# Patient Record
Sex: Male | Born: 1980 | Race: Black or African American | Hispanic: No | Marital: Single | Smoking: Never smoker
Health system: Southern US, Community
[De-identification: ages and names within clinical notes are randomized; demographics above are authoritative.]

## PROBLEM LIST (undated history)

## (undated) DIAGNOSIS — M549 Dorsalgia, unspecified: Secondary | ICD-10-CM

## (undated) DIAGNOSIS — R569 Unspecified convulsions: Secondary | ICD-10-CM

## (undated) DIAGNOSIS — J45909 Unspecified asthma, uncomplicated: Secondary | ICD-10-CM

## (undated) HISTORY — DX: Unspecified convulsions: R56.9

---

## 2002-03-01 ENCOUNTER — Emergency Department (HOSPITAL_COMMUNITY): Admission: EM | Admit: 2002-03-01 | Discharge: 2002-03-01 | Payer: Self-pay | Admitting: *Deleted

## 2002-03-01 ENCOUNTER — Encounter: Payer: Self-pay | Admitting: *Deleted

## 2008-04-12 ENCOUNTER — Ambulatory Visit: Payer: Self-pay | Admitting: Family Medicine

## 2008-04-12 DIAGNOSIS — J309 Allergic rhinitis, unspecified: Secondary | ICD-10-CM | POA: Insufficient documentation

## 2008-04-12 DIAGNOSIS — R569 Unspecified convulsions: Secondary | ICD-10-CM

## 2008-06-28 ENCOUNTER — Ambulatory Visit: Payer: Self-pay | Admitting: Family Medicine

## 2008-07-04 ENCOUNTER — Encounter (INDEPENDENT_AMBULATORY_CARE_PROVIDER_SITE_OTHER): Payer: Self-pay | Admitting: Family Medicine

## 2008-07-29 ENCOUNTER — Encounter (INDEPENDENT_AMBULATORY_CARE_PROVIDER_SITE_OTHER): Payer: Self-pay | Admitting: Family Medicine

## 2008-09-27 ENCOUNTER — Ambulatory Visit: Payer: Self-pay | Admitting: Family Medicine

## 2008-09-27 DIAGNOSIS — G809 Cerebral palsy, unspecified: Secondary | ICD-10-CM | POA: Insufficient documentation

## 2009-10-06 ENCOUNTER — Emergency Department (HOSPITAL_COMMUNITY): Admission: EM | Admit: 2009-10-06 | Discharge: 2009-10-06 | Payer: Self-pay | Admitting: Emergency Medicine

## 2009-10-13 ENCOUNTER — Ambulatory Visit (HOSPITAL_COMMUNITY): Admission: RE | Admit: 2009-10-13 | Discharge: 2009-10-13 | Payer: Self-pay | Admitting: Family Medicine

## 2009-11-12 ENCOUNTER — Ambulatory Visit (HOSPITAL_COMMUNITY): Admission: RE | Admit: 2009-11-12 | Discharge: 2009-11-12 | Payer: Self-pay | Admitting: Family Medicine

## 2010-03-30 ENCOUNTER — Emergency Department (HOSPITAL_COMMUNITY)
Admission: EM | Admit: 2010-03-30 | Discharge: 2010-03-30 | Payer: Self-pay | Source: Home / Self Care | Admitting: Emergency Medicine

## 2010-07-26 ENCOUNTER — Emergency Department (HOSPITAL_COMMUNITY): Payer: Medicare Other

## 2010-07-26 ENCOUNTER — Emergency Department (HOSPITAL_COMMUNITY)
Admission: EM | Admit: 2010-07-26 | Discharge: 2010-07-26 | Disposition: A | Discharge status: Home / Self Care | Payer: Medicare Other | Attending: Emergency Medicine | Admitting: Emergency Medicine

## 2010-07-26 DIAGNOSIS — G40802 Other epilepsy, not intractable, without status epilepticus: Secondary | ICD-10-CM | POA: Insufficient documentation

## 2010-07-26 DIAGNOSIS — Z79899 Other long term (current) drug therapy: Secondary | ICD-10-CM | POA: Insufficient documentation

## 2010-07-26 DIAGNOSIS — J40 Bronchitis, not specified as acute or chronic: Secondary | ICD-10-CM | POA: Insufficient documentation

## 2010-07-26 DIAGNOSIS — J479 Bronchiectasis, uncomplicated: Secondary | ICD-10-CM | POA: Insufficient documentation

## 2010-07-26 LAB — BASIC METABOLIC PANEL
BUN: 10 mg/dL (ref 6–23)
Chloride: 107 mEq/L (ref 96–112)
GFR calc non Af Amer: >60 mL/min (ref >60)
Potassium: 4.3 mEq/L (ref 3.5–5.1)
Sodium: 137 mEq/L (ref 135–145)

## 2010-07-26 MED ORDER — IOHEXOL 300 MG/ML  SOLN
80.0000 mL | Freq: Once | INTRAMUSCULAR | Status: AC | PRN
  Administered 2010-07-26: 80 mL via INTRAVENOUS

## 2010-08-25 ENCOUNTER — Encounter (INDEPENDENT_AMBULATORY_CARE_PROVIDER_SITE_OTHER): Payer: Medicare Other | Admitting: Thoracic Surgery

## 2010-08-25 DIAGNOSIS — J479 Bronchiectasis, uncomplicated: Secondary | ICD-10-CM

## 2010-08-26 NOTE — Letter (Signed)
Aug 25, 2010  Edward L. Juanetta Gosling, MD [address] [place], [place] [address]  Re:  Jeremy [name], Jeremy [name]                DOB:  04-Dec-1980  Dear Ramon Dredge:  I appreciate the opportunity of seeing the patient.  This 30 year old patient had a [place]ke[place] history of seizure disorder and apparently was found to have a left lower lobe chronic infection.  He gets infection every year according to his family.  He had a bronchoscopy in 2000, showed mucus plugging in left lower lobe.  Recent CT scan showed a completely destroyed left lower lobe and this with secondary to bronchiectasis.  He continues no evidence of infection at other places.  He also has a seizure disorder and he is a nonsmoker and lives with his family.  He has had no hemoptysis.  PAST MEDICAL HISTORY:  Unremarkable except for the seizure disorders and probably decreased mental ability.  FAMILY HISTORY:  Noncontributory.  SOCIAL HISTORY:  He is single.  Does not smoke or drink.  REVIEW OF SYSTEMS:  His weight is stable. CARDIAC:  No angina or atrial fibrillation. PULMONARY:  See history of present illness.  No hemoptysis. GI:  No nausea, vomiting, constipation, or diarrhea. GU:  No kidney disease, dysuria, or frequent urination. VASCULAR:  No claudication, DVT, or TIAs. NEUROLOGIC:  See history of present illness. MUSCULOSKELETAL:  Rash. PSYCHIATRIC:  No depression or nervous. EYES AND ENT:  No changes in his eyesight or hearing. HEMATOLOGIC:  No problems with bleeding, clotting disorders.  On evaluation, it looks like his left lower lobe is completely destroyed.  I think he needs to have repeat bronchoscopy and then probably we will proceed with a left lower lobectomy.  I have tentatively scheduled this for May 31 at [facility].  I appreciate the opportunity of seeing the patient.  Ines Bloomer, M.D. Electronically Signed  DPB/MEDQ  D:  08/25/2010  T:  08/26/2010  Job:  [id number]  cc:   Mila Homer. Sudie Bailey, M.D.

## 2010-09-03 ENCOUNTER — Encounter (HOSPITAL_COMMUNITY)
Admission: RE | Admit: 2010-09-03 | Discharge: 2010-09-03 | Disposition: A | Discharge status: Home / Self Care | Payer: Medicare Other | Source: Ambulatory Visit | Attending: Thoracic Surgery | Admitting: Thoracic Surgery

## 2010-09-03 LAB — CBC
HCT: 46 % (ref 39.0–52.0)
Hemoglobin: 16.4 g/dL (ref 13.0–17.0)
MCV: 82 fL (ref 78.0–100.0)
RBC: 5.61 MIL/uL (ref 4.22–5.81)
RDW: 14.3 % (ref 11.5–15.5)
WBC: 3.2 10*3/uL — ABNORMAL LOW (ref 4.0–10.5)

## 2010-09-03 LAB — SURGICAL PCR SCREEN: Staphylococcus aureus: NEGATIVE

## 2010-09-03 LAB — BLOOD GAS, ARTERIAL
Acid-base deficit: 3.4 mmol/L — ABNORMAL HIGH (ref 0.0–2.0)
Bicarbonate: 20.6 mEq/L (ref 20.0–24.0)
TCO2: 21.6 mmol/L (ref 0–100)
pCO2 arterial: 33.9 mmHg — ABNORMAL LOW (ref 35.0–45.0)
pH, Arterial: 7.399 (ref 7.350–7.450)
pO2, Arterial: 75.2 mmHg — ABNORMAL LOW (ref 80.0–100.0)

## 2010-09-03 LAB — URINALYSIS, ROUTINE W REFLEX MICROSCOPIC
Nitrite: NEGATIVE
Specific Gravity, Urine: 1.015 (ref 1.005–1.030)
Urobilinogen, UA: 0.2 mg/dL (ref 0.0–1.0)
pH: 7.5 (ref 5.0–8.0)

## 2010-09-03 LAB — URINE MICROSCOPIC-ADD ON

## 2010-09-03 LAB — COMPREHENSIVE METABOLIC PANEL
AST: 15 U/L (ref 0–37)
BUN: 8 mg/dL (ref 6–23)
CO2: 21 mEq/L (ref 19–32)
Chloride: 107 mEq/L (ref 96–112)
Creatinine, Ser: 1.03 mg/dL (ref 0.4–1.5)
GFR calc non Af Amer: >60 mL/min (ref >60)
Glucose, Bld: 104 mg/dL — ABNORMAL HIGH (ref 70–99)
Total Bilirubin: 0.4 mg/dL (ref 0.3–1.2)

## 2010-09-03 LAB — ABO/RH: ABO/RH(D): A POS

## 2010-09-03 LAB — PROTIME-INR
INR: 1.05 (ref 0.00–1.49)
Prothrombin Time: 13.9 seconds (ref 11.6–15.2)

## 2010-09-07 ENCOUNTER — Other Ambulatory Visit: Payer: Self-pay | Admitting: Thoracic Surgery

## 2010-09-07 ENCOUNTER — Inpatient Hospital Stay (HOSPITAL_COMMUNITY): Payer: Medicare Other

## 2010-09-07 ENCOUNTER — Inpatient Hospital Stay (HOSPITAL_COMMUNITY)
Admission: RE | Admit: 2010-09-07 | Discharge: 2010-09-12 | DRG: 164 | Disposition: A | Discharge status: Home / Self Care | Payer: Medicare Other | Source: Ambulatory Visit | Attending: Thoracic Surgery | Admitting: Thoracic Surgery

## 2010-09-07 ENCOUNTER — Ambulatory Visit (HOSPITAL_COMMUNITY)
Admission: RE | Admit: 2010-09-07 | Discharge: 2010-09-07 | Disposition: A | Discharge status: Home / Self Care | Payer: Medicare Other | Source: Ambulatory Visit | Attending: Thoracic Surgery | Admitting: Thoracic Surgery

## 2010-09-07 DIAGNOSIS — G40909 Epilepsy, unspecified, not intractable, without status epilepticus: Secondary | ICD-10-CM | POA: Diagnosis present

## 2010-09-07 DIAGNOSIS — J479 Bronchiectasis, uncomplicated: Secondary | ICD-10-CM

## 2010-09-07 DIAGNOSIS — Z880 Allergy status to penicillin: Secondary | ICD-10-CM

## 2010-09-07 DIAGNOSIS — Y9210 Unspecified residential institution as the place of occurrence of the external cause: Secondary | ICD-10-CM | POA: Diagnosis not present

## 2010-09-07 DIAGNOSIS — Z01810 Encounter for preprocedural cardiovascular examination: Secondary | ICD-10-CM

## 2010-09-07 DIAGNOSIS — IMO0002 Reserved for concepts with insufficient information to code with codable children: Secondary | ICD-10-CM | POA: Diagnosis not present

## 2010-09-07 DIAGNOSIS — Z01812 Encounter for preprocedural laboratory examination: Secondary | ICD-10-CM

## 2010-09-07 DIAGNOSIS — Y836 Removal of other organ (partial) (total) as the cause of abnormal reaction of the patient, or of later complication, without mention of misadventure at the time of the procedure: Secondary | ICD-10-CM | POA: Diagnosis not present

## 2010-09-07 HISTORY — PX: OTHER SURGICAL HISTORY: SHX169

## 2010-09-07 LAB — CBC
HCT: 36.6 % — ABNORMAL LOW (ref 39.0–52.0)
MCH: 28.2 pg (ref 26.0–34.0)
MCHC: 34.4 g/dL (ref 30.0–36.0)
MCHC: 34 g/dL (ref 30.0–36.0)
Platelets: 168 10*3/uL (ref 150–400)
RBC: 3.4 MIL/uL — ABNORMAL LOW (ref 4.22–5.81)
RDW: 14.3 % (ref 11.5–15.5)

## 2010-09-07 NOTE — H&P (Signed)
  NAME:  Jeremy [name], Jeremy [name] NO.:  [id number]  MEDICAL RECORD NO.:  [id number]           PATIENT TYPE:  I  LOCATION:  [place]                         FACILITY:  [facility]  PHYSICIAN:  Ines Bloomer, M.D. DATE OF BIRTH:  1980-08-06  DATE OF ADMISSION:  08/26/2010 DATE OF DISCHARGE:                             HISTORY & PHYSICAL   CHIEF COMPLAINT:  Chronic infection.  HISTORY OF PRESENT ILLNESS:  This is a 30 year old African American male who was found to have [place] history of seizure disorders and left lower lobe infection.  He has infection at least once or twice a year.  He had a bronchoscopy in 2000, showed mucous plug in the left lower lobe.  A recent CT scan showed a destroyed left lower lobe secondary to bronchiectasis.  No other areas of infection.  He is a nonsmoker.  Has had no hemoptysis.  Has a Ladouceur history of seizures.  PAST MEDICAL HISTORY:  Significant for seizures.  MEDICATIONS:   75 mg a day, Topamax 200 mg a day, and ProAir.  He has a rash.  FAMILY HISTORY:  Noncontributory.  SOCIAL HISTORY:  Single.  Does not drink or smoke.  REVIEW OF SYSTEMS:  Weight stable.  CARDIAC:  No angina or atrial fibrillation.  PULMONARY:  See history of present illness.  GI:  No nausea, vomiting, constipation, or diarrhea.  GU:  No kidney disease, dysuria, or frequent urination.  VASCULAR:  No claudication, DVT, or TIAs.  NEUROLOGIC:  No dizziness, headaches, blackouts, or seizures. MUSCULOSKELETAL:  Rash.  PSYCHIATRIC:  No depression or nervousness. ENT:  No changes in eyesight or hearing.  HEMATOLOGIC:  No problems with bleeding, clotting disorders, or anemia.  PHYSICAL EXAM:  VITAL SIGNS:  His blood pressure is 116/70, pulse 72, respirations 18, sats were 95%. HEAD, EYES, EARS, NOSE, AND THROAT:  Unremarkable. NECK:  Supple without thyromegaly.  There is no supraclavicular or axillary adenopathy. CHEST:  Clear to auscultation and percussion. HEART:  Regular  sinus rhythm.  No murmurs. ABDOMEN:  Soft.  No hepatosplenomegaly. EXTREMITIES:  Pulses are 2+.  There is no clubbing or edema. NEUROLOGIC:  He is oriented x3.  Sensory and motor intact.  Cranial nerves intact.  IMPRESSION: 1. Chronic bronchiectasis, left lower lobe. 2. Seizure disorder.  PLAN:  Left lower lobectomy.     Ines Bloomer, M.D.     DPB/MEDQ  D:  09/04/2010  T:  09/05/2010  Job:  [id number]  Electronically Signed by Jovita Gamma M.D. on 09/07/2010 12:51:26 PM

## 2010-09-08 ENCOUNTER — Inpatient Hospital Stay (HOSPITAL_COMMUNITY): Payer: Medicare Other

## 2010-09-08 DIAGNOSIS — IMO0002 Reserved for concepts with insufficient information to code with codable children: Secondary | ICD-10-CM

## 2010-09-08 HISTORY — PX: OTHER SURGICAL HISTORY: SHX169

## 2010-09-08 LAB — BASIC METABOLIC PANEL
Calcium: 7.6 mg/dL — ABNORMAL LOW (ref 8.4–10.5)
GFR calc Af Amer: >60 mL/min (ref >60)
GFR calc non Af Amer: >60 mL/min (ref >60)
Sodium: 137 mEq/L (ref 135–145)

## 2010-09-08 LAB — CBC
HCT: 31.5 % — ABNORMAL LOW (ref 39.0–52.0)
Platelets: 117 10*3/uL — ABNORMAL LOW (ref 150–400)
RDW: 14.3 % (ref 11.5–15.5)
WBC: 10.1 10*3/uL (ref 4.0–10.5)

## 2010-09-09 ENCOUNTER — Inpatient Hospital Stay (HOSPITAL_COMMUNITY): Payer: Medicare Other

## 2010-09-09 LAB — CBC
HCT: 32 % — ABNORMAL LOW (ref 39.0–52.0)
Hemoglobin: 11.1 g/dL — ABNORMAL LOW (ref 13.0–17.0)
RBC: 3.87 MIL/uL — ABNORMAL LOW (ref 4.22–5.81)
WBC: 12.1 10*3/uL — ABNORMAL HIGH (ref 4.0–10.5)

## 2010-09-09 LAB — COMPREHENSIVE METABOLIC PANEL
ALT: 10 U/L (ref 0–53)
Alkaline Phosphatase: 35 U/L — ABNORMAL LOW (ref 39–117)
CO2: 23 mEq/L (ref 19–32)
GFR calc non Af Amer: >60 mL/min (ref >60)
Glucose, Bld: 117 mg/dL — ABNORMAL HIGH (ref 70–99)
Potassium: 4.1 mEq/L (ref 3.5–5.1)
Sodium: 136 mEq/L (ref 135–145)
Total Protein: 5.2 g/dL — ABNORMAL LOW (ref 6.0–8.3)

## 2010-09-09 LAB — TYPE AND SCREEN
ABO/RH(D): A POS
Antibody Screen: NEGATIVE
Unit division: 0
Unit division: 0

## 2010-09-09 LAB — CULTURE, RESPIRATORY W GRAM STAIN

## 2010-09-09 LAB — POCT I-STAT 3, ART BLOOD GAS (G3+)
Acid-base deficit: 6 mmol/L — ABNORMAL HIGH (ref 0.0–2.0)
Bicarbonate: 20.9 mEq/L (ref 20.0–24.0)
O2 Saturation: 98 %
TCO2: 22 mmol/L (ref 0–100)
pO2, Arterial: 118 mmHg — ABNORMAL HIGH (ref 80.0–100.0)

## 2010-09-09 LAB — POCT I-STAT 7, (LYTES, BLD GAS, ICA,H+H)
Hemoglobin: 10.5 g/dL — ABNORMAL LOW (ref 13.0–17.0)
Potassium: 4.4 mEq/L (ref 3.5–5.1)
Sodium: 137 mEq/L (ref 135–145)
TCO2: 20 mmol/L (ref 0–100)
pH, Arterial: 7.242 — ABNORMAL LOW (ref 7.350–7.450)

## 2010-09-10 ENCOUNTER — Inpatient Hospital Stay (HOSPITAL_COMMUNITY): Payer: Medicare Other

## 2010-09-10 LAB — CBC
HCT: 30 % — ABNORMAL LOW (ref 39.0–52.0)
Hemoglobin: 10.4 g/dL — ABNORMAL LOW (ref 13.0–17.0)
MCH: 28.8 pg (ref 26.0–34.0)
MCHC: 34.7 g/dL (ref 30.0–36.0)
MCV: 83.1 fL (ref 78.0–100.0)

## 2010-09-10 LAB — BASIC METABOLIC PANEL
BUN: 8 mg/dL (ref 6–23)
CO2: 25 mEq/L (ref 19–32)
Calcium: 8.2 mg/dL — ABNORMAL LOW (ref 8.4–10.5)
Creatinine, Ser: 0.68 mg/dL (ref 0.4–1.5)
Glucose, Bld: 114 mg/dL — ABNORMAL HIGH (ref 70–99)

## 2010-09-10 LAB — TISSUE CULTURE

## 2010-09-11 ENCOUNTER — Inpatient Hospital Stay (HOSPITAL_COMMUNITY): Payer: Medicare Other

## 2010-09-11 NOTE — Op Note (Signed)
  NAMEKALEEL, SCHMIEDER NO.:  [id number]  MEDICAL RECORD NO.:  [id number]  LOCATION:                                 FACILITY:  PHYSICIAN:  Ines Bloomer, M.D. DATE OF BIRTH:  Aug 25, 1980  DATE OF PROCEDURE: DATE OF DISCHARGE:                              OPERATIVE REPORT   PREOPERATIVE DIAGNOSIS:  Status post left lower lobectomy with postoperative hemorrhage.  POSTOPERATIVE DIAGNOSIS:  Status post left lower lobectomy with postoperative hemorrhage.  OPERATION PERFORMED:  Left thoracotomy, exploration for hemorrhage.  SURGEON:  Ines Bloomer, MD  General anesthesia.  This patient at approximately 12 hours earlier had a left lower lobectomy and did fine for the first 6-8 hours and then started having bleeding and his hematocrit went from 46 to 36 and then down to 28 with 2 episodes of hypotension.  Because of continued bleeding, he was brought back to the operating room for exploration.  After general anesthesia, he was turned to the left lateral thoracotomy position.  A single-lumen tube had been inserted.  He was prepped and draped in the usual sterile manner.  The previous thoracotomy incision was opened and the sutures were removed and the pericostals were removed and there was a moderate amount of blood clot in the left chest.  This was evacuated mechanically as well as with suction until it was completed.  We then looked for areas of bleeding.  The inferior pulmonary ligament had no areas of bleeding and there was no bleeding from the chest wall, but along the staple line of the left lower lobe there were few areas of bleeding from the staple line.  This was oversewn with 3-0 Vicryl.  The patient then had an intercostal muscle flap and there was no bleeding from that and no bleeding from any bronchial arteries.  After we secured the bleeding, we then applied CoSeal to the staple line, just it was an extra adequate coverage for any possible  bleeding.  Two 36 chest tubes were brought in through the old trocar sites and sutured in place with 0 silk.  The chest was closed with 3 pericostals, #1 Vicryl in the muscle layer, 2-0 Vicryl in the subcutaneous tissue and Ethicon skin clips. The patient was returned to recovery room in serious condition.     Ines Bloomer, M.D.     DPB/MEDQ  D:  09/08/2010  T:  09/08/2010  Job:  [id number]  Electronically Signed by Jovita Gamma M.D. on 09/11/2010 02:23:38 PM

## 2010-09-11 NOTE — Op Note (Signed)
NAMENOEH, SPARACINO NO.:  [id number]  MEDICAL RECORD NO.:  [id number]  LOCATION:  XRAY                         FACILITY:  [facility]  PHYSICIAN:  Ines Bloomer, M.D. DATE OF BIRTH:  21-Sep-1980  DATE OF PROCEDURE:  09/07/2010 DATE OF DISCHARGE:                              OPERATIVE REPORT   PREOPERATIVE DIAGNOSIS:  Left lower lobe bronchiectasis.  POSTOPERATIVE DIAGNOSIS:  Left lower lobe bronchiectasis.  OPERATION PERFORMED:  Left VATS minithoracotomy, left lower lobectomy.  SURGEON:  Ines Bloomer, MD.  ANESTHESIA:  General.  After percutaneous insertion of all monitoring lines, the patient underwent general anesthesia, was prepped and draped in the usual sterile manner.  A video bronchoscope was passed through the regular endotracheal tube.  The carina was in the midline.  The left mainstem was normal.  The right mainstem was normal.  The right upper lobe, right middle lobe, and right lower lobe orifices were normal.  The left upper lobe orifices were normal, but there was frank pus in the left lower lobe.  This 30 year old patient had a Fonte history of left lower lobe chronic infections, and the CT scan had showed that the left lower lobe was markedly contracted secondary to chronic infections.  Cultures were taken from the left lower lobe, and the video bronchoscope was removed. We then switched to a dual-lumen tube, and I turned the patient to the left lateral thoracotomy position and collapsed left lung.  The patient was then prepped and draped.  We did two trocar sites.  One in the anterior and posterior axons seventh intercostal space and inserted two trocars, and we could see the scarred left lower lobe with the scope. Because of this, we proceeded with an incision of the sixth intercostal space partially dividing latissimus and reflecting the serratus anteriorly and entering the space.  We decided to take down the intercostal muscle.  We  did that with electrocautery taking it out and putting it and then suturing it.  We did not divide it yet, and sutured it anteriorly.  TPA was placed in the space.  The inferior pulmonary ligament was taken down with electrocautery.  There were a lot of large blood vessels in the inferior pulmonary ligament, and these had to be clipped and then divided.  After the inferior pulmonary ligament was taken down, we then dissected out the inferior pulmonary vein, which was really stuck to the bronchus, but we were able to get around it and looped with a vascular tape.  We turned our attention to the fissure and we identified the pulmonary artery and we were able to dissect along the inferior portion of the fissure and divided it with a Covidien purple stapler.  We then dissected the superior portion of the fissure and divided it with Covidien purple stapler.  The line was a superior segmental branch exposed, and we divided it a Covidien gray 30 stapler. We dissected out the basilar branches of the pulmonary artery, which were very stuck to the bronchus, but we were able to get them off and stapled and divided with a Covidien hook stapler and finally, we dissected up the bronchus and stapled it with  a Covidien purple 45 stapler.  The left lower lobe was removed.  We checked for air leak and none was found.  We then divided the intercostal muscle medially and sutured the intercostal vessel through the bronchial stump with 3-0 silk stitches.  ProGEL was applied to the staple line.  A 24 chest tube was brought in anteriorly and a 28 chest tube brought in posteriorly, and sutured in place with 0 silk.  A Marcaine block was done in the usual fashion.  Chest was closed with three pericostal drilling through the seventh rib and passed around the sixth rib, and #1 Vicryl in the muscle layer, 2-0 Vicryl in the subcutaneous tissue and Dermabond for the skin. The patient was turned to the recovery room in  stable condition.     Ines Bloomer, M.D.     DPB/MEDQ  D:  09/07/2010  T:  09/07/2010  Job:  [id number]  Electronically Signed by Jovita Gamma M.D. on 09/11/2010 02:23:34 PM

## 2010-09-12 ENCOUNTER — Inpatient Hospital Stay (HOSPITAL_COMMUNITY): Payer: Medicare Other

## 2010-09-12 LAB — CBC
MCH: 28.4 pg (ref 26.0–34.0)
MCHC: 34 g/dL (ref 30.0–36.0)
MCV: 83.6 fL (ref 78.0–100.0)
Platelets: 252 10*3/uL (ref 150–400)

## 2010-09-12 LAB — BASIC METABOLIC PANEL
Calcium: 8.3 mg/dL — ABNORMAL LOW (ref 8.4–10.5)
Creatinine, Ser: 0.73 mg/dL (ref 0.4–1.5)
GFR calc non Af Amer: >60 mL/min (ref >60)
Sodium: 137 mEq/L (ref 135–145)

## 2010-09-19 NOTE — Discharge Summary (Signed)
[name], [name] NO.:  [id number]  MEDICAL RECORD NO.:  [id number]  LOCATION:                                 FACILITY:  PHYSICIAN:  Ines Bloomer, M.D. DATE OF BIRTH:  11/23/1980  DATE OF ADMISSION: DATE OF DISCHARGE:                              DISCHARGE SUMMARY   FINAL DIAGNOSIS:  Left lower lobe bronchiectasis.  IN-HOSPITAL DIAGNOSES:  Status post left lower lobectomy with postoperative hemorrhage.  SECONDARY DIAGNOSIS:  History of seizures.  IN-HOSPITAL OPERATIONS AND PROCEDURES: 1. Left video-assisted thoracoscopic surgery with minithoracotomy and     left lower lobectomy done by Dr. Edwyna Shell September 07, 2010. 2. Left thoracotomy for exploration for hemorrhage done by Dr. Edwyna Shell     September 08, 2010.  HISTORY AND PHYSICAL AND HOSPITAL COURSE:  The patient is a 30 year old African American male who was found to have a Jeremy [name] history of seizure disorders and left lower lobe infections.  He has left lower lobe infection at least once or twice year.  He had a bronchoscopy in 2000 which showed mucous plus in the left lower lobe.  A recent CT scan showed a scarred left lower lobe secondary to bronchiectasis.  There is no other areas of infection.  The patient is a nonsmoker.  He has had no hemoptysis.  The patient was seen and evaluated by Dr. Edwyna Shell.  Dr. Edwyna Shell discussed with the patient undergoing left lower lobectomy.  He discussed risks and benefits with the patient.  The patient acknowledged understanding and agreed to proceed.  Surgery was scheduled for September 07, 2010.  For further details of the patient's past medical history and physical exam please see dictated H and P.  The patient was taken to the operating room on September 07, 2010, where he underwent left video-assisted thoracoscopic surgery with left mini thoracotomy, left lower lobectomy done by Dr. Edwyna Shell.  The patient tolerated this procedure well and was transferred to the intensive care unit  in stable condition.  The patient was able to be extubated following surgery.  Postextubation, the patient was alert and oriented x4.  Neuro intact.  Evening of surgery, the patient had increased bleeding from chest tubes with a drop in his hematocrit.  He was taken back to the operating room that evening and underwent left thoracotomy for exploration for hemorrhage by Dr. Edwyna Shell.  The patient tolerated this procedure well and was transfer back to the intensive care unit in stable condition.  Following surgery following day the patient had no further bleeding.  His hematocrit was 32%.  Chest x-ray obtained was noted to be stable.  No air leak noted from chest tube.  Daily chest x- rays were followed and noted to remain stable.  Anterior chest tube was discontinued postop day #4 with remaining chest tube discontinued C postop day #5.  We will plan to obtain PA and lateral chest x-ray in the a.m.  His most recent x-ray showed tiny residual left apex pneumothorax with minimal atelectasis.  During this time, the patient was encouraged to use his incentive spirometer.  He had been able to be weaned off oxygen with O2 saturations maintaining greater than 90%  on room air. During the patient's postoperative course, he has remained afebrile with normal sinus rhythm.  Blood pressure stable.  His hemoglobin/hematocrit continued to be followed and remained stable.  The patient was up and ambulating without difficulty.  He is tolerating diet well.  No nausea, vomiting noted.  All incisions are clean, dry, and intact and healing well.  The patient's most recent lab work shows sodium of 134, potassium 3.9, chloride 103, bicarbonate 35, BUN 25, creatinine 1.68, glucose 114. White blood cell count 9.4, hemoglobin 10.4, hematocrit 30.0, platelet count 142.  The patient is tentatively ready for discharge to home in 24 to 48 hours pending he remained stable.  FOLLOWUP APPOINTMENTS:  Follow-up appointment  have been arranged with Dr. Edwyna Shell for September 22, 2010, at 1 o'clock p.m.  The patient need to obtain PA and lateral chest x-ray 45 minutes prior to this appointment.  ACTIVITY:  Patient instructed no driving until released to do so, no lifting over 10 pounds.  He is told to ambulate 3 to 4 times per day, progress as tolerated and continue his breathing exercises.  INCISIONAL CARE:  The patient is told to shower washing his incisions using soap and water.  He is to contact the office if he develops any drainage or opening from any of his incision sites.  DIET:  The patient was educated on diet to be low-fat, low-salt.  DISCHARGE MEDICATIONS: 1. Percocet 5/325 one to two tablets q.4 h. p.r.n. pain. 2. Fish oil 1000 mg daily. 3. Lamotrigine 150 mg half tablet daily. 4. ProAir 2 puffs 4 times daily as needed. 5. Topiramate 200 mg daily. 6. Tums 2 tablets daily.     Sol Blazing, PA   ______________________________ Ines Bloomer, M.D.    KMD/MEDQ  D:  09/11/2010  T:  09/12/2010  Job:  [id number]  cc:   Ines Bloomer, M.D.  Electronically Signed by Cameron Proud PA on 09/18/2010 04:09:25 PM Electronically Signed by Jovita Gamma M.D. on 09/19/2010 08:14:37 PM

## 2010-09-21 ENCOUNTER — Other Ambulatory Visit: Payer: Self-pay | Admitting: Thoracic Surgery

## 2010-09-21 DIAGNOSIS — C3430 Malignant neoplasm of lower lobe, unspecified bronchus or lung: Secondary | ICD-10-CM

## 2010-09-22 ENCOUNTER — Ambulatory Visit
Admission: RE | Admit: 2010-09-22 | Discharge: 2010-09-22 | Disposition: A | Discharge status: Home / Self Care | Payer: Medicare Other | Source: Ambulatory Visit | Attending: Thoracic Surgery | Admitting: Thoracic Surgery

## 2010-09-22 ENCOUNTER — Ambulatory Visit (INDEPENDENT_AMBULATORY_CARE_PROVIDER_SITE_OTHER): Payer: Self-pay | Admitting: Thoracic Surgery

## 2010-09-22 DIAGNOSIS — J479 Bronchiectasis, uncomplicated: Secondary | ICD-10-CM

## 2010-09-22 DIAGNOSIS — C3430 Malignant neoplasm of lower lobe, unspecified bronchus or lung: Secondary | ICD-10-CM

## 2010-09-23 NOTE — Assessment & Plan Note (Signed)
OFFICE VISIT  Jeremy [name], Jeremy [name] DOB:  September 09, 1980                                        September 22, 2010 CHART #:  [id number]  HISTORY:  The patient is a 30 year old male who underwent left video- assisted thoracoscopic surgery with mini thoracotomy for left lower lobectomy due to left lower lobe bronchiectasis.  This was done on September 07, 2010, and pathology revealed the following. 1. Dilated and inflamed bronchi. 2. Benign lung parenchyma with diffuse intraalveolar hemorrhage, no     evidence of malignancy.  For full details, please see the dictated     pathology report.  The procedure was initially complicated from     postoperative hemorrhage requiring redo thoracotomy.  He currently     reports that he is only having minimal discomfort.  He denies     shortness of breath.  He is coughing up small amounts of clear     sputum.  He is having no fevers, chills or other constitutional     symptoms.  Chest x-ray was obtained on today's date.  It shows continued improvement in airspaces.  There is no evidence of pneumonia, congestive failure, or significant effusions.  PHYSICAL EXAMINATION:  Vital Signs:  Blood pressure is 104/58, pulse 84, respirations 16, oxygen saturation is 97% on room air.  General Appearance:  Well-developed thin male in no acute distress.  Incision is inspected healing well without evidence of infection.  Staples and chest tube sutures were removed at this time.  He tolerated this well. Pulmonary:  Clear lung fields throughout.  Cardiac:  Regular rate and rhythm.  ASSESSMENT:  The patient continues to make excellent recovery following his surgery.  He is encouraged to continue pulmonary toilet.  He will continue to clean his incisions gently with soap and water.  We will see him again in the office in 3 weeks for further followup.  Rowe Clack, P.A.-C.  Sherryll Burger  D:  09/22/2010  T:  09/23/2010  Job:  [id number]  cc:   Ramon Dredge L.  Juanetta Gosling, M.D. Mila Homer. Sudie Bailey, M.D.

## 2010-10-13 ENCOUNTER — Ambulatory Visit (INDEPENDENT_AMBULATORY_CARE_PROVIDER_SITE_OTHER): Payer: Self-pay | Admitting: Thoracic Surgery

## 2010-10-13 DIAGNOSIS — J8410 Pulmonary fibrosis, unspecified: Secondary | ICD-10-CM

## 2010-10-14 NOTE — Letter (Signed)
October 13, 2010    Re:  Jeremy [name], Jeremy [name]                DOB:  Feb 01, 1981    The patient came today.  His incisions are well-healed.  His lungs were clear to auscultation and percussion.  He is doing well overall.  There was one Vicryl stitch that I removed from his wound, but I will see him back again in 6 weeks with a chest x-ray for final check.  Ines Bloomer, M.D. Electronically Signed  DPB/MEDQ  D:  10/13/2010  T:  10/14/2010  Job:  [id number]

## 2010-12-03 ENCOUNTER — Other Ambulatory Visit: Payer: Self-pay | Admitting: Thoracic Surgery

## 2010-12-03 DIAGNOSIS — C3430 Malignant neoplasm of lower lobe, unspecified bronchus or lung: Secondary | ICD-10-CM

## 2010-12-09 ENCOUNTER — Ambulatory Visit
Admission: RE | Admit: 2010-12-09 | Discharge: 2010-12-09 | Disposition: A | Discharge status: Home / Self Care | Payer: Medicare Other | Source: Ambulatory Visit | Attending: Thoracic Surgery | Admitting: Thoracic Surgery

## 2010-12-09 ENCOUNTER — Encounter: Payer: Self-pay | Admitting: Thoracic Surgery

## 2010-12-09 ENCOUNTER — Ambulatory Visit (INDEPENDENT_AMBULATORY_CARE_PROVIDER_SITE_OTHER): Payer: Self-pay | Admitting: Thoracic Surgery

## 2010-12-09 VITALS — BP 114/75 | HR 67 | Resp 18 | Ht 78.0 in | Wt 160.0 lb

## 2010-12-09 DIAGNOSIS — Z09 Encounter for follow-up examination after completed treatment for conditions other than malignant neoplasm: Secondary | ICD-10-CM

## 2010-12-09 DIAGNOSIS — C3430 Malignant neoplasm of lower lobe, unspecified bronchus or lung: Secondary | ICD-10-CM

## 2010-12-09 DIAGNOSIS — R569 Unspecified convulsions: Secondary | ICD-10-CM | POA: Insufficient documentation

## 2010-12-09 DIAGNOSIS — J479 Bronchiectasis, uncomplicated: Secondary | ICD-10-CM | POA: Insufficient documentation

## 2010-12-09 NOTE — Progress Notes (Signed)
HPI  All returns today after his left lower lobectomy for chronic infection. His incisions are well-healed and he's doing well overall. I will release him back to his medical doctor. Chest x-ray shows just minor scarring in the left base Current Outpatient Prescriptions  Medication Sig Dispense Refill  . Albuterol Sulfate (PROAIR HFA IN) Inhale into the lungs. 2 puffs 4 times daily as needed       . calcium carbonate (TUMS - DOSED IN MG ELEMENTAL CALCIUM) 500 MG chewable tablet Chew 2 tablets by mouth daily.        . fish oil-omega-3 fatty acids 1000 MG capsule Take 1 g by mouth daily.        Marland Kitchen lamoTRIgine (LAMICTAL) 150 MG tablet Take 150 mg by mouth. TAKE HALF TABLET       . oxyCODONE-acetaminophen (PERCOCET) 5-325 MG per tablet Take 1 tablet by mouth every 4 (four) hours as needed.        . topiramate (TOPAMAX) 200 MG tablet Take 200 mg by mouth daily.           Review of Systems: No change   Physical Exam  Cardiovascular: Normal rate and regular rhythm.   Pulmonary/Chest: Effort normal and breath sounds normal.     Diagnostic Tests: Chest x-ray shows some scarring in the left base   Impression: Status post lobectomy for chronic infection.  Plan: Followup by PCP

## 2012-03-02 IMAGING — CR DG CHEST 1V PORT
1 series · 1 of 1 positions shown · non-contrast
Comparison: Portable chest x-ray of 09/09/2010

CLINICAL DATA: Status post left VATS, follow-up

PORTABLE CHEST - 1 VIEW

[view not recorded]
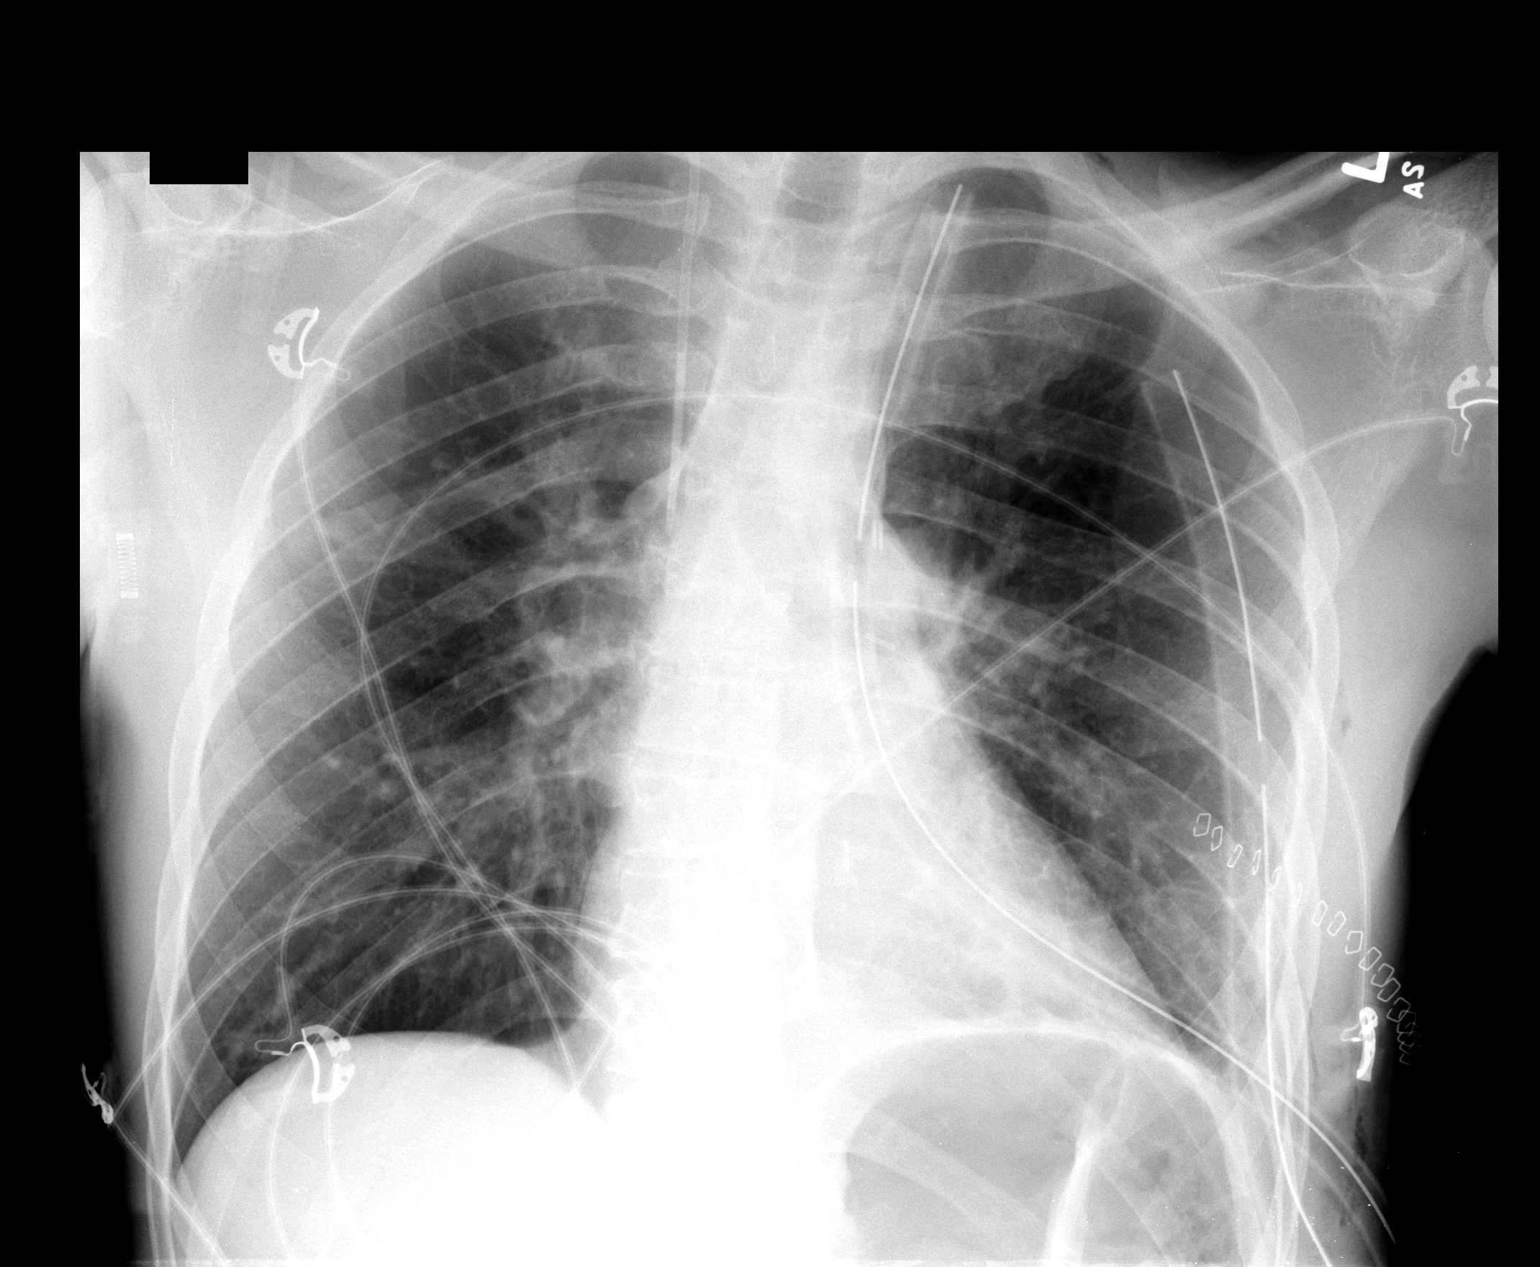

[1 of 1 positions shown; findings below may reference images not displayed]

FINDINGS: There is only a tiny left apical pneumothorax present
with two left chest tube remaining.  A small amount of subcutaneous
air is noted in the left chest and left lower neck.  Mild left
basilar atelectasis remains.  The right lung is clear.  Right IJ
central venous catheter tip is unchanged and mild cardiomegaly is
stable.
IMPRESSION: 1.  Tiny left apical pneumothorax with two left chest tube
remaining.
2.  Mild left basilar atelectasis.

## 2012-03-03 IMAGING — CR DG CHEST 2V
2 series · 2 of 2 positions shown · non-contrast
Comparison: 09/10/2010

CLINICAL DATA: Pneumothorax, chest tube

CHEST - 2 VIEW

[w chest pa]
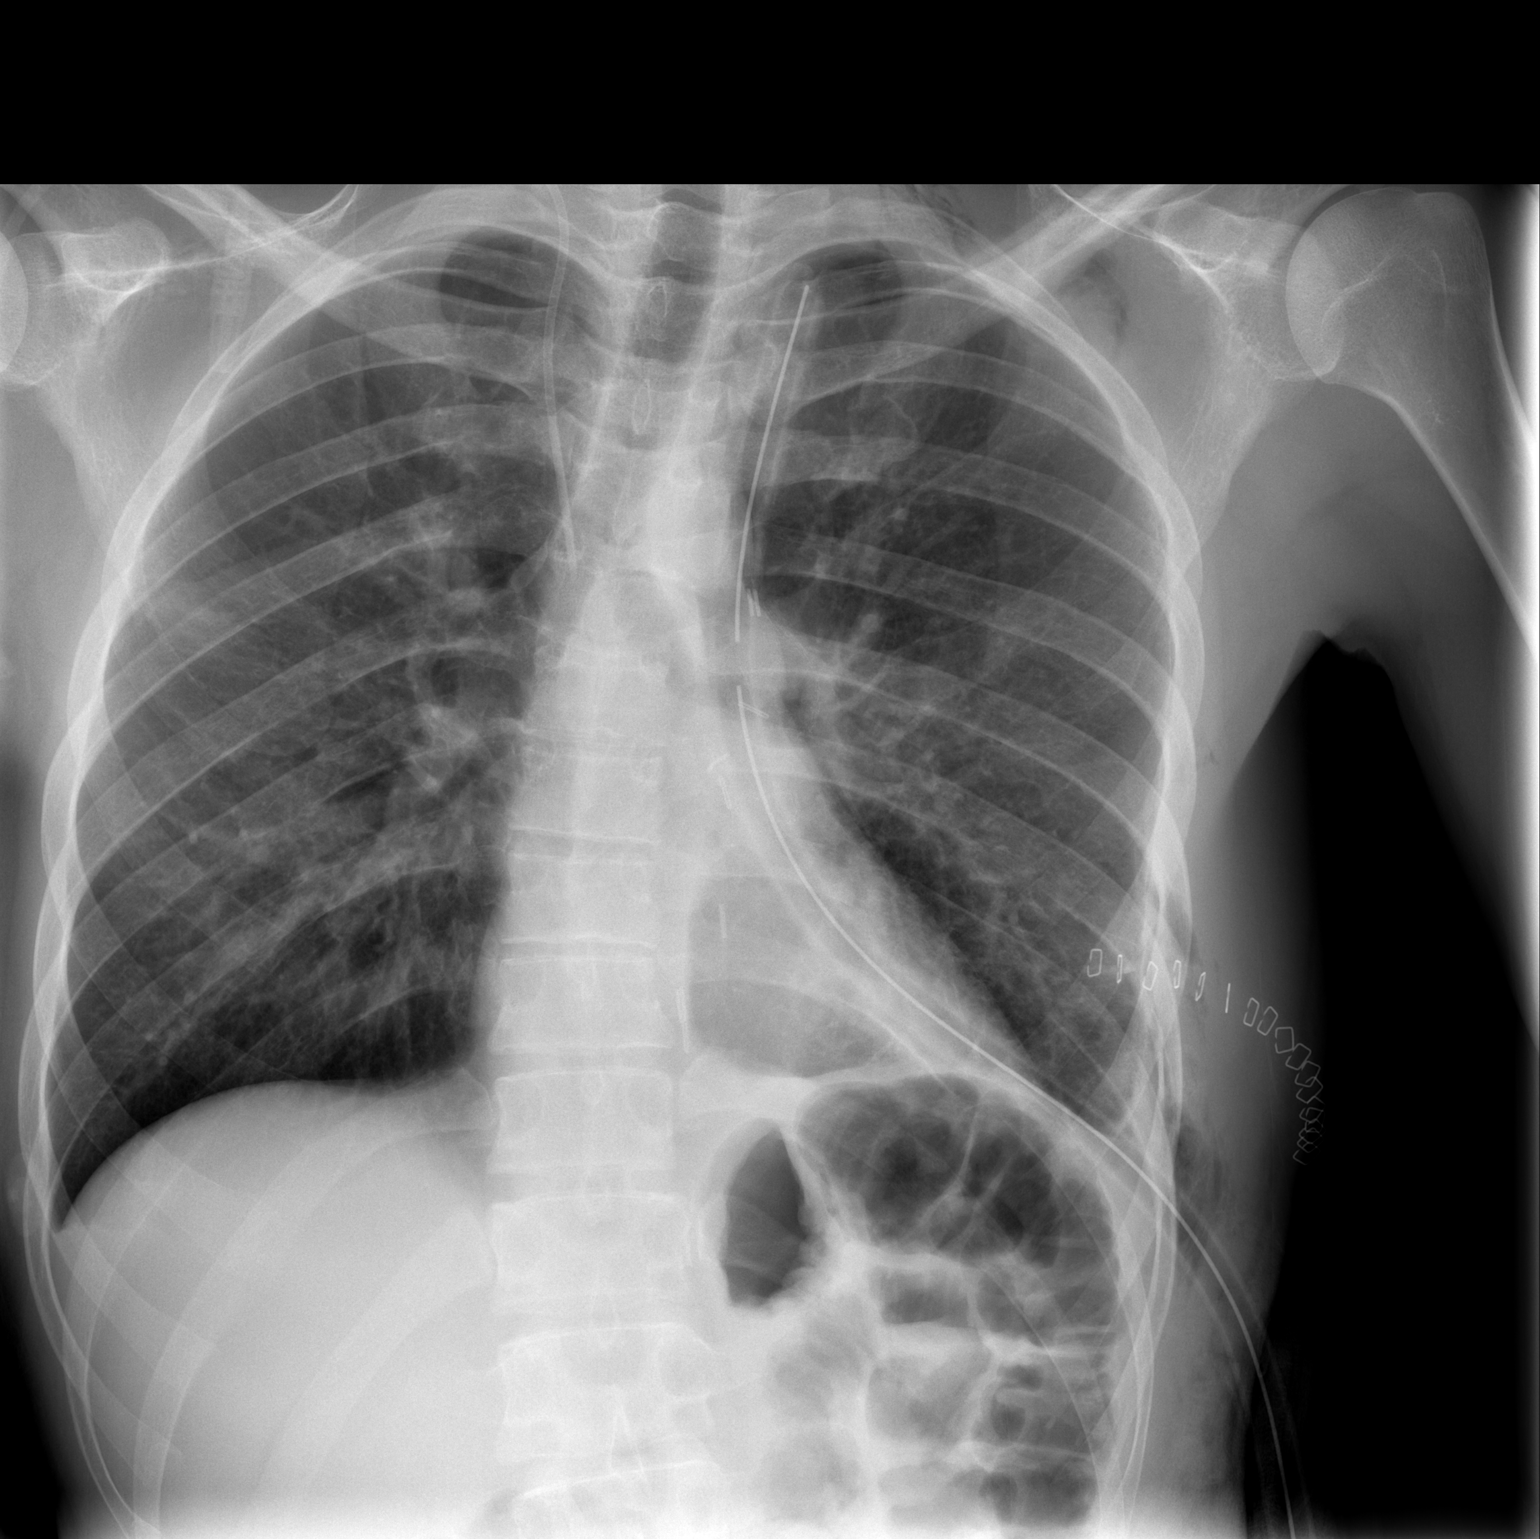

[w chest lat]
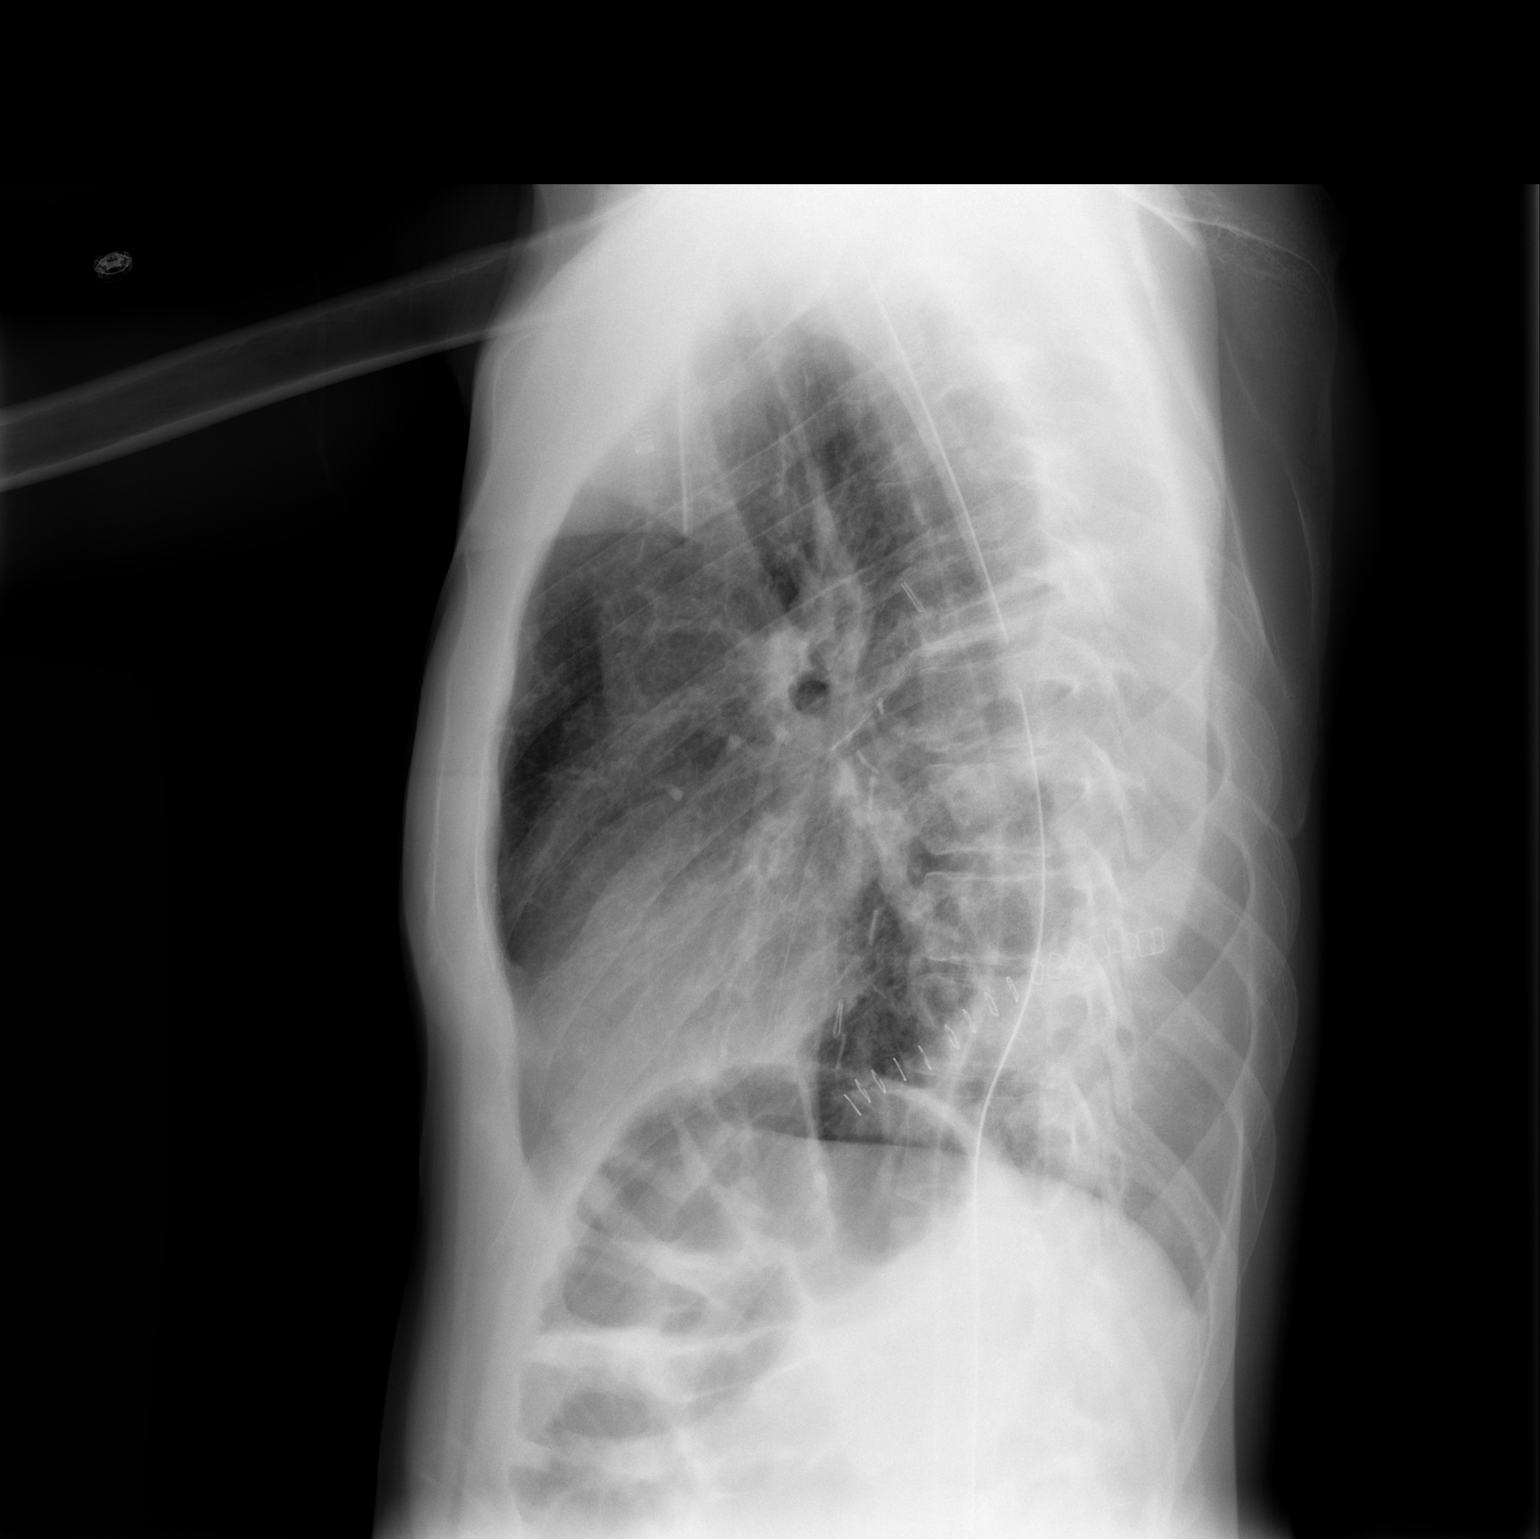

[2 of 2 positions shown; findings below may reference images not displayed]

FINDINGS: Right jugular central venous catheter, tip SVC.
Left thoracostomy tube stable, tip at apex.
A second/lateral left thoracostomy tube removed in interval since
prior study.
Tiny left apex pneumothorax.
Small amounts of subcutaneous gas at left lateral chest wall and
left cervical region.
Heart size stable.
Mediastinal contours and pulmonary vascularity normal.
Probable atelectasis at left base and right mid lung.
Lungs otherwise clear.
Skin clips and mild soft tissue swelling project over left lateral
chest wall.
IMPRESSION: Tiny residual left apex pneumothorax despite thoracostomy tube.
Interval removal of second thoracostomy tube.
Minimal atelectasis.

## 2012-03-14 IMAGING — CR DG CHEST 2V
2 series · 2 of 2 positions shown · non-contrast
Comparison: Chest x-ray of 09/12/2010 and CT chest of 07/26/2010

CLINICAL DATA: History of lung carcinoma, postop

CHEST - 2 VIEW

[w chest pa]
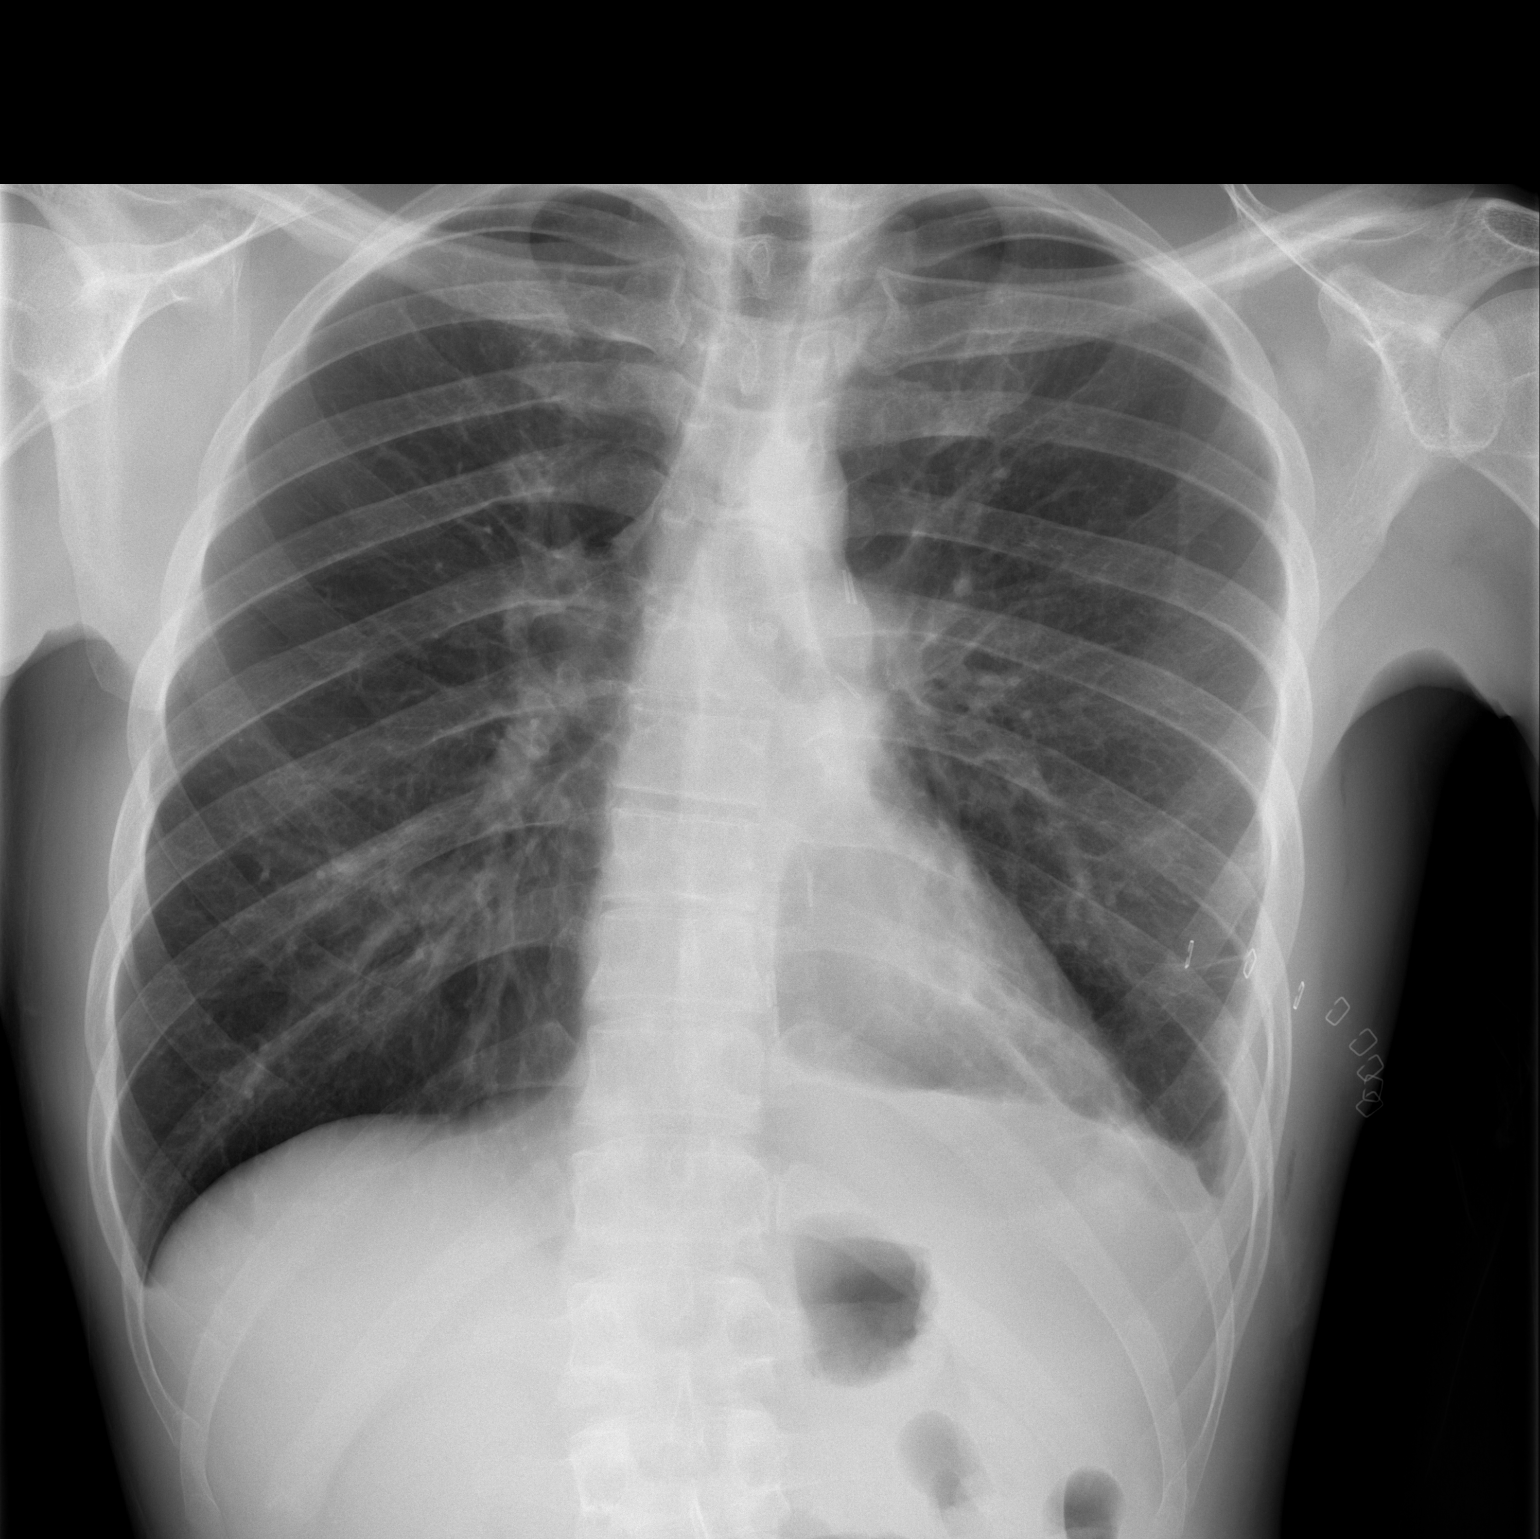

[w chest lat]
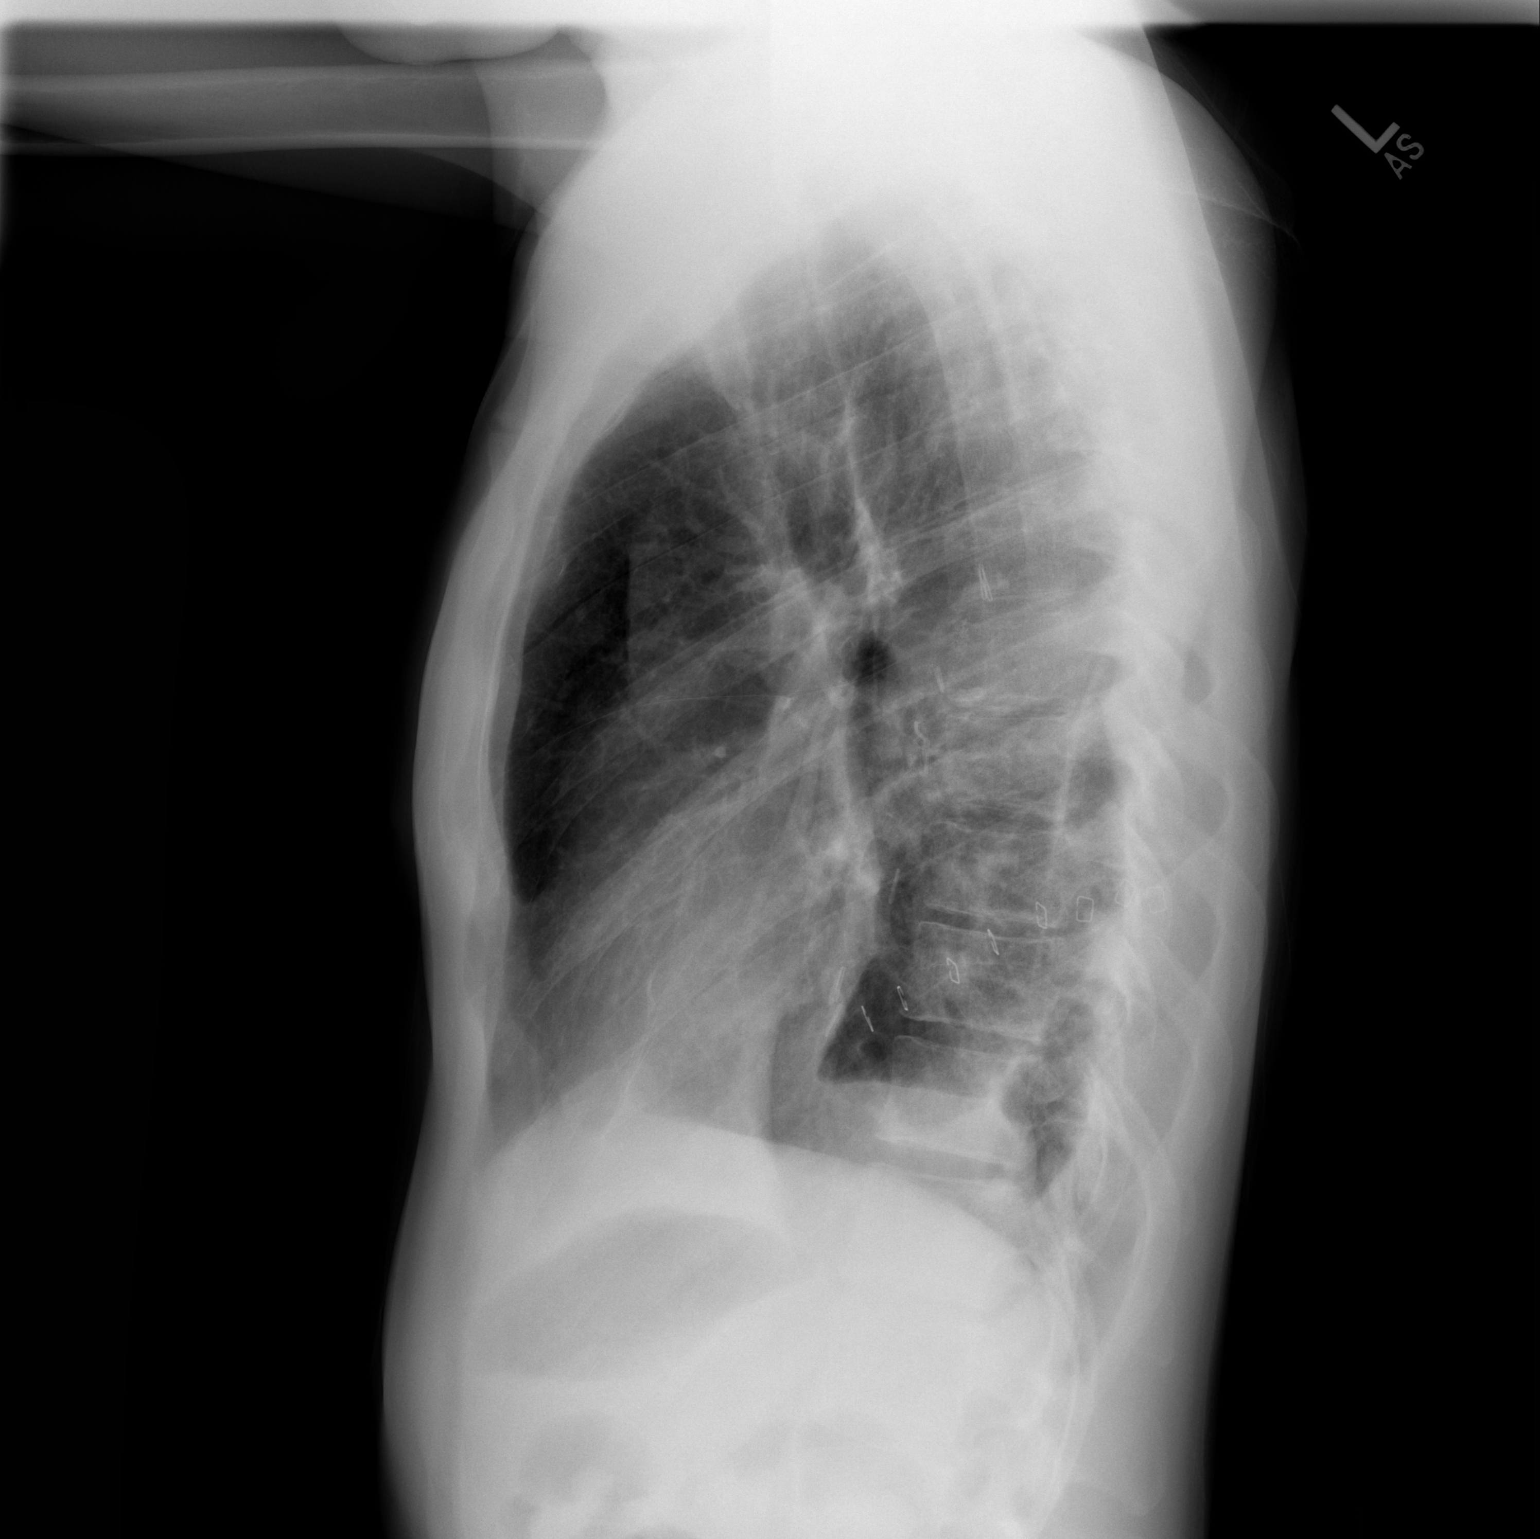

[2 of 2 positions shown; findings below may reference images not displayed]

FINDINGS: Pleural thickening remains postoperatively at the left
lung base.  No pneumothorax is seen.  The previously noted left
chest wall subcutaneous air has resolved.  The right lung is clear.
Mediastinal contours are stable.  The heart is within normal limits
in size.
IMPRESSION: Stable postop change with mild pleural thickening at the left lung
base.

## 2012-08-27 ENCOUNTER — Emergency Department (HOSPITAL_COMMUNITY)
Admission: EM | Admit: 2012-08-27 | Discharge: 2012-08-27 | Disposition: A | Discharge status: Home / Self Care | Payer: Medicare Other | Attending: Emergency Medicine | Admitting: Emergency Medicine

## 2012-08-27 ENCOUNTER — Encounter (HOSPITAL_COMMUNITY): Payer: Self-pay | Admitting: Emergency Medicine

## 2012-08-27 ENCOUNTER — Emergency Department (HOSPITAL_COMMUNITY): Payer: Medicare Other

## 2012-08-27 DIAGNOSIS — J189 Pneumonia, unspecified organism: Secondary | ICD-10-CM

## 2012-08-27 DIAGNOSIS — Z8781 Personal history of (healed) traumatic fracture: Secondary | ICD-10-CM | POA: Insufficient documentation

## 2012-08-27 DIAGNOSIS — Z79899 Other long term (current) drug therapy: Secondary | ICD-10-CM | POA: Insufficient documentation

## 2012-08-27 DIAGNOSIS — G40909 Epilepsy, unspecified, not intractable, without status epilepticus: Secondary | ICD-10-CM | POA: Insufficient documentation

## 2012-08-27 DIAGNOSIS — R071 Chest pain on breathing: Secondary | ICD-10-CM | POA: Insufficient documentation

## 2012-08-27 DIAGNOSIS — Z8709 Personal history of other diseases of the respiratory system: Secondary | ICD-10-CM | POA: Insufficient documentation

## 2012-08-27 DIAGNOSIS — Z880 Allergy status to penicillin: Secondary | ICD-10-CM | POA: Insufficient documentation

## 2012-08-27 DIAGNOSIS — J159 Unspecified bacterial pneumonia: Secondary | ICD-10-CM | POA: Insufficient documentation

## 2012-08-27 MED ORDER — AZITHROMYCIN 250 MG PO TABS: ORAL_TABLET | ORAL | Status: DC

## 2012-08-27 MED ORDER — BENZONATATE 200 MG PO CAPS: 200.0000 mg | ORAL_CAPSULE | Freq: Three times a day (TID) | ORAL | Status: DC | PRN

## 2012-08-27 MED ORDER — AZITHROMYCIN 250 MG PO TABS
500.0000 mg | ORAL_TABLET | Freq: Once | ORAL | Status: AC
  Administered 2012-08-27: 500 mg via ORAL
  Filled 2012-08-27: qty 2

## 2012-08-27 MED ORDER — BENZONATATE 100 MG PO CAPS
200.0000 mg | ORAL_CAPSULE | Freq: Once | ORAL | Status: AC
  Administered 2012-08-27: 200 mg via ORAL
  Filled 2012-08-27: qty 2

## 2012-08-27 NOTE — ED Notes (Signed)
Mother states pt has had prod cough x 1 month now and is white. Pt Denies pain. Mother states pt has been c/o L rib pain. Lung sounds clear in triage

## 2012-08-27 NOTE — ED Provider Notes (Signed)
History     CSN: [id number]  Arrival date & time 08/27/12  1520   First MD Initiated Contact with Patient 08/27/12 1557      Chief Complaint  Patient presents with  . Cough    (Consider location/radiation/quality/duration/timing/severity/associated sxs/prior treatment) Patient is a 32 y.o. male presenting with cough. The history is provided by the patient and a parent.  Cough Cough characteristics:  Productive Sputum characteristics:  White Severity:  Moderate Onset quality:  Gradual Duration:  4 weeks Timing:  Intermittent Progression:  Unchanged Chronicity:  Chronic Smoker: no   Context: not animal exposure, not sick contacts, not smoke exposure and not upper respiratory infection   Context comment:  He has a history of seasonal allergies and bronchiectasis,  with surgical left lower lobectomy. Relieved by: cough syrup and mucinex provides some relief. Worsened by:  Nothing tried Ineffective treatments:  Beta-agonist inhaler Associated symptoms: chest pain   Associated symptoms: no chills, no ear fullness, no ear pain, no fever, no headaches, no rash, no rhinorrhea, no shortness of breath, no sinus congestion, no sore throat and no wheezing   Associated symptoms comment:  He reports left sided rib pain,  But only when he coughs when supine.     Past Medical History  Diagnosis Date  . Seizures   . Bronchiectasis     Chronic, left lower lobe    Past Surgical History  Procedure Laterality Date  . Left lower lobectomy  09/07/2010    Dr Edwyna Shell  . Left thoracotomy, exploration for hemorrhage  09/08/10    Dr Edwyna Shell    History reviewed. No pertinent family history.  History  Substance Use Topics  . Smoking status: Never Smoker   . Smokeless tobacco: Never Used  . Alcohol Use: No      Review of Systems  Constitutional: Negative for fever and chills.  HENT: Negative for ear pain, congestion, sore throat, rhinorrhea and neck pain.   Eyes: Negative.   Respiratory:  Positive for cough. Negative for chest tightness, shortness of breath, wheezing and stridor.   Cardiovascular: Positive for chest pain.  Gastrointestinal: Negative for nausea and abdominal pain.  Genitourinary: Negative.   Musculoskeletal: Negative for joint swelling and arthralgias.  Skin: Negative.  Negative for rash and wound.  Neurological: Negative for dizziness, weakness, light-headedness, numbness and headaches.  Psychiatric/Behavioral: Negative.     Allergies  Penicillins  Home Medications   Current Outpatient Rx  Name  Route  Sig  Dispense  Refill  . Albuterol Sulfate (PROAIR HFA IN)   Inhalation   Inhale into the lungs. 2 puffs 4 times daily as needed          . azithromycin (ZITHROMAX) 250 MG tablet      Take one tablet daily for 4 days.   4 tablet   0   . benzonatate (TESSALON) 200 MG capsule   Oral   Take 1 capsule (200 mg total) by mouth 3 (three) times daily as needed for cough.   21 capsule   0   . calcium carbonate (TUMS - DOSED IN MG ELEMENTAL CALCIUM) 500 MG chewable tablet   Oral   Chew 2 tablets by mouth daily.           . fish oil-omega-3 fatty acids 1000 MG capsule   Oral   Take 1 g by mouth daily.           Marland Kitchen lamoTRIgine (LAMICTAL) 150 MG tablet   Oral   Take 150 mg  by mouth. TAKE HALF TABLET          . oxyCODONE-acetaminophen (PERCOCET) 5-325 MG per tablet   Oral   Take 1 tablet by mouth every 4 (four) hours as needed.           . topiramate (TOPAMAX) 200 MG tablet   Oral   Take 200 mg by mouth daily.             BP 96/61  Pulse 79  Temp(Src) 98.4 F (36.9 C) (Oral)  Resp 18  SpO2 97%  Physical Exam  Nursing note and vitals reviewed. Constitutional: He appears well-developed and well-nourished.  HENT:  Head: Normocephalic and atraumatic.  Eyes: Conjunctivae are normal.  Neck: Normal range of motion. Neck supple.  Cardiovascular: Normal rate, regular rhythm, normal heart sounds and intact distal pulses.    Pulmonary/Chest: Effort normal and breath sounds normal. No respiratory distress. He has no wheezes. He has no rales. He exhibits no tenderness.  Non tender at site of old rib fracture with palpation.  Abdominal: Soft. Bowel sounds are normal. There is no tenderness.  Musculoskeletal: Normal range of motion.  Neurological: He is alert.  Skin: Skin is warm and dry.  Psychiatric: He has a normal mood and affect.    ED Course  Procedures (including critical care time)  Labs Reviewed - No data to display Dg Chest 2 View  08/27/2012   *RADIOLOGY REPORT*  Clinical Data: Cough and shortness of breath.  CHEST - 2 VIEW  Comparison: 12/09/2010.  Findings: The lungs are slightly hyperexpanded but appear clear. There is increased density at the left lung base in an area which demonstrated scarring on the previous exam.  The heart and mediastinal structures appear normal.  Surgical clips are present in the left hilum and in the mediastinum.   There is an apparent old fracture of the left seventh rib.  The bony thorax is otherwise intact.  IMPRESSION: Chronic pulmonary disease. Increased density in the left lower lobe which is consistent with a small area of pneumonia   Original Report Authenticated By: Sander Radon, M.D.     1. Community acquired pneumonia       MDM  Pneumonia vs scarring on xray.  Pt with history of bronchiectasis so increased risk of infection.  Will cover with zithromax, first dose given here.  He was also prescribed tessalon perles.  Encouraged hydrocodone qhs for cough and chest wall pain (pt has script).  Albuterol q 4 hours (pt has).  F/u with pcp this week for a recheck,  Sooner for any worsened sx including sob or fever.        Burgess Amor, PA-C 08/27/12 1730

## 2012-08-28 NOTE — ED Provider Notes (Signed)
Medical screening examination/treatment/procedure(s) were performed by non-physician practitioner and as supervising physician I was immediately available for consultation/collaboration.    Vida Roller, MD 08/28/12 [phone]

## 2012-09-09 ENCOUNTER — Emergency Department (HOSPITAL_COMMUNITY)
Admission: EM | Admit: 2012-09-09 | Discharge: 2012-09-09 | Disposition: A | Discharge status: Home / Self Care | Payer: Medicare Other | Attending: Emergency Medicine | Admitting: Emergency Medicine

## 2012-09-09 ENCOUNTER — Encounter (HOSPITAL_COMMUNITY): Payer: Self-pay | Admitting: Emergency Medicine

## 2012-09-09 DIAGNOSIS — Z8709 Personal history of other diseases of the respiratory system: Secondary | ICD-10-CM | POA: Insufficient documentation

## 2012-09-09 DIAGNOSIS — M5380 Other specified dorsopathies, site unspecified: Secondary | ICD-10-CM | POA: Insufficient documentation

## 2012-09-09 DIAGNOSIS — Z880 Allergy status to penicillin: Secondary | ICD-10-CM | POA: Insufficient documentation

## 2012-09-09 DIAGNOSIS — T703XXA Caisson disease [decompression sickness], initial encounter: Secondary | ICD-10-CM

## 2012-09-09 DIAGNOSIS — R6889 Other general symptoms and signs: Secondary | ICD-10-CM | POA: Insufficient documentation

## 2012-09-09 DIAGNOSIS — G40909 Epilepsy, unspecified, not intractable, without status epilepticus: Secondary | ICD-10-CM | POA: Insufficient documentation

## 2012-09-09 DIAGNOSIS — Z79899 Other long term (current) drug therapy: Secondary | ICD-10-CM | POA: Insufficient documentation

## 2012-09-09 HISTORY — DX: Unspecified asthma, uncomplicated: J45.909

## 2012-09-09 LAB — GLUCOSE, CAPILLARY: Glucose-Capillary: 77 mg/dL (ref 70–99)

## 2012-09-09 NOTE — ED Provider Notes (Signed)
History    This chart was scribed for Benny Lennert, MD by Leone Payor, ED Scribe. This patient was seen in room APA06/APA06 and the patient's care was started 5:03 PM.   CSN: [id number]  Arrival date & time 09/09/12  1641   First MD Initiated Contact with Patient 09/09/12 1658      Chief Complaint  Patient presents with  . Seizures     Patient is a 32 y.o. male presenting with shortness of breath. The history is provided by the patient and a parent. No language interpreter was used.  Shortness of Breath Severity:  Mild Onset quality:  Sudden Timing:  Unable to specify Progression:  Resolved Chronicity:  Recurrent Associated symptoms: no abdominal pain, no chest pain, no cough, no headaches and no rash     HPI Comments: Jeremy [name] is a 32 y.o. male brought in by ambulance, who presents to the Emergency Department complaining of a new episode of SOB that occurred earlier today. Pt was with mother at a yard sale when he became hot and had a back spasm. The back spasm caused some irritation in his L arm and caused him to become SOB and "choke" on some jello. Per pt, the difficulty breathing has resolved and feels no discomfort currently. Mother states he has episodes like this frequently and have occurred ever since an operation 2 years ago. Pt denies smoking and alcohol use.   Past Medical History  Diagnosis Date  . Seizures   . Bronchiectasis     Chronic, left lower lobe    Past Surgical History  Procedure Laterality Date  . Left lower lobectomy  09/07/2010    Dr Edwyna Shell  . Left thoracotomy, exploration for hemorrhage  09/08/10    Dr Edwyna Shell    No family history on file.  History  Substance Use Topics  . Smoking status: Never Smoker   . Smokeless tobacco: Never Used  . Alcohol Use: No      Review of Systems  Constitutional: Negative for appetite change and fatigue.  HENT: Negative for congestion, sinus pressure and ear discharge.   Eyes: Negative for discharge.   Respiratory: Positive for shortness of breath. Negative for cough.   Cardiovascular: Negative for chest pain.  Gastrointestinal: Negative for abdominal pain and diarrhea.  Genitourinary: Negative for frequency and hematuria.  Musculoskeletal: Positive for back pain.  Skin: Negative for rash.  Neurological: Negative for headaches.  Psychiatric/Behavioral: Negative for hallucinations.    Allergies  Penicillins  Home Medications   Current Outpatient Rx  Name  Route  Sig  Dispense  Refill  . Albuterol Sulfate (PROAIR HFA IN)   Inhalation   Inhale into the lungs. 2 puffs 4 times daily as needed          . azithromycin (ZITHROMAX) 250 MG tablet      Take one tablet daily for 4 days.   4 tablet   0   . benzonatate (TESSALON) 200 MG capsule   Oral   Take 1 capsule (200 mg total) by mouth 3 (three) times daily as needed for cough.   21 capsule   0   . calcium carbonate (TUMS - DOSED IN MG ELEMENTAL CALCIUM) 500 MG chewable tablet   Oral   Chew 2 tablets by mouth daily.           . fish oil-omega-3 fatty acids 1000 MG capsule   Oral   Take 1 g by mouth daily.           Marland Kitchen  lamoTRIgine (LAMICTAL) 150 MG tablet   Oral   Take 150 mg by mouth. TAKE HALF TABLET          . oxyCODONE-acetaminophen (PERCOCET) 5-325 MG per tablet   Oral   Take 1 tablet by mouth every 4 (four) hours as needed.           . topiramate (TOPAMAX) 200 MG tablet   Oral   Take 200 mg by mouth daily.             BP 122/83  Pulse 102  Temp(Src) 98.3 F (36.8 C) (Oral)  Resp 19  Ht 6\' 2"  (1.88 m)  Wt 160 lb (72.576 kg)  BMI 20.53 kg/m2  SpO2 96%  Physical Exam  Nursing note and vitals reviewed. Constitutional: He is oriented to person, place, and time. He appears well-developed.  HENT:  Head: Normocephalic.  Eyes: Conjunctivae and EOM are normal. No scleral icterus.  Neck: Neck supple. No thyromegaly present.  Cardiovascular: Normal rate, regular rhythm and normal heart sounds.   Exam reveals no gallop and no friction rub.   No murmur heard. Pulmonary/Chest: Breath sounds normal. No stridor. He has no wheezes. He has no rales. He exhibits no tenderness.  Abdominal: He exhibits no distension. There is no tenderness. There is no rebound.  Musculoskeletal: Normal range of motion. He exhibits no edema.  Lymphadenopathy:    He has no cervical adenopathy.  Neurological: He is oriented to person, place, and time. Coordination normal.  Skin: No rash noted. No erythema.  Psychiatric: He has a normal mood and affect. His behavior is normal.    ED Course  Procedures (including critical care time)  DIAGNOSTIC STUDIES: Oxygen Saturation is 96% on room air, adequate by my interpretation.    COORDINATION OF CARE: 5:11 PM Discussed treatment plan with pt at bedside and pt agreed to plan.   Labs Reviewed - No data to display No results found.   No diagnosis found.    MDM      The chart was scribed for me under my direct supervision.  I personally performed the history, physical, and medical decision making and all procedures in the evaluation of this patient.Benny Lennert, MD 09/09/12 [phone]

## 2012-09-09 NOTE — ED Notes (Addendum)
Per EMS, pt was out with his mom at a yard sale when he became hot and "choked" on jello. Pt has a hx of seizures. Per EMS pt mother reports "pt had an episode where he "tenses up and falls x2 weeks"." pt had an episode while eating jello and "choked". Pt has hx of MR. nad noted.

## 2013-05-05 ENCOUNTER — Emergency Department (HOSPITAL_COMMUNITY)
Admission: EM | Admit: 2013-05-05 | Discharge: 2013-05-06 | Disposition: A | Discharge status: Home / Self Care | Payer: Medicare Other | Attending: Emergency Medicine | Admitting: Emergency Medicine

## 2013-05-05 ENCOUNTER — Encounter (HOSPITAL_COMMUNITY): Payer: Self-pay | Admitting: Emergency Medicine

## 2013-05-05 DIAGNOSIS — J45909 Unspecified asthma, uncomplicated: Secondary | ICD-10-CM | POA: Insufficient documentation

## 2013-05-05 DIAGNOSIS — Z79899 Other long term (current) drug therapy: Secondary | ICD-10-CM | POA: Insufficient documentation

## 2013-05-05 DIAGNOSIS — R112 Nausea with vomiting, unspecified: Secondary | ICD-10-CM | POA: Insufficient documentation

## 2013-05-05 DIAGNOSIS — Z880 Allergy status to penicillin: Secondary | ICD-10-CM | POA: Insufficient documentation

## 2013-05-05 DIAGNOSIS — G40909 Epilepsy, unspecified, not intractable, without status epilepticus: Secondary | ICD-10-CM | POA: Insufficient documentation

## 2013-05-05 MED ORDER — ONDANSETRON 4 MG PO TBDP
4.0000 mg | ORAL_TABLET | Freq: Once | ORAL | Status: AC
  Administered 2013-05-05: 4 mg via ORAL
  Filled 2013-05-05: qty 1

## 2013-05-05 MED ORDER — ONDANSETRON HCL 4 MG PO TABS: 4.0000 mg | ORAL_TABLET | Freq: Three times a day (TID) | ORAL | Status: DC | PRN

## 2013-05-05 NOTE — ED Notes (Signed)
Started vomiting about 1630 today.  Has vomited about 6 times.  Denies diarrhea, fever. Has had cough.

## 2013-05-05 NOTE — ED Provider Notes (Signed)
CSN: [id number]     Arrival date & time 05/05/13  2034 History  This chart was scribed for Jeremy Norrie, MD by Eston Mould, ED Scribe. This patient was seen in room APA04/APA04 and the patient's care was started at  10:04 PM  Chief Complaint  Patient presents with  . Emesis   Patient is a 33 y.o. male presenting with vomiting. The history is provided by the patient. No language interpreter was used.  Emesis  HPI Comments: Jeremy [name] is a 33 y.o. male who presents to the Emergency Department complaining of several episodes of emesis that began 6 hours ago. Pt states he has had 6 episodes of emesis since about 4 pm tonight. Yesterday he was laying around and not as active. He states "he is not currently feeling sick". Pt states his 12 year old niece, whom he lives with, niece was recently sick with a stomach virus. Pt states his PCP is still Dr. Karie [name]. Pt denies drinking and smoking. Pt denies having dry mouth, abdominal pain, diarrhea, dizzy, light headed or fever.   PCP Dr Jeremy [name]  Past Medical History  Diagnosis Date  . Seizures   . Bronchiectasis     Chronic, left lower lobe  . Asthma    Past Surgical History  Procedure Laterality Date  . Left lower lobectomy  09/07/2010    Dr Arlyce Dice  . Left thoracotomy, exploration for hemorrhage  09/08/10    Dr Arlyce Dice   History reviewed. No pertinent family history. History  Substance Use Topics  . Smoking status: Never Smoker   . Smokeless tobacco: Never Used  . Alcohol Use: No  lives with his mother and sister and neice  Review of Systems  Gastrointestinal: Positive for vomiting.  All other systems reviewed and are negative.    Allergies  Penicillins  Home Medications   Current Outpatient Rx  Name  Route  Sig  Dispense  Refill  . albuterol (PROVENTIL HFA;VENTOLIN HFA) 108 (90 BASE) MCG/ACT inhaler   Inhalation   Inhale 2 puffs into the lungs every 4 (four) hours as needed for wheezing or shortness of breath.          . calcium carbonate (TUMS - DOSED IN MG ELEMENTAL CALCIUM) 500 MG chewable tablet   Oral   Chew 2 tablets by mouth daily.           . fish oil-omega-3 fatty acids 1000 MG capsule   Oral   Take 1 g by mouth daily.           Marland Kitchen lamoTRIgine (LAMICTAL) 150 MG tablet   Oral   Take 150 mg by mouth daily.          Marland Kitchen topiramate (TOPAMAX) 200 MG tablet   Oral   Take 200 mg by mouth daily.            ED Triage Vitals  Enc Vitals Group     BP 05/05/13 2111 123/63 mmHg     Pulse Rate 05/05/13 2111 85     Resp 05/05/13 2111 17     Temp 05/05/13 2111 100.5 F (38.1 C)     Temp src 05/05/13 2111 Oral     SpO2 05/05/13 2111 94 %     Weight 05/05/13 2111 176 lb 3.2 oz (79.924 kg)     Height 05/05/13 2111 6\' 3"  (1.905 m)     Head Cir --      Peak Flow --      Pain  Score --      Pain Loc --      Pain Edu? --      Excl. in [place]? --     Vital signs normal except low grade fever   Physical Exam  Nursing note and vitals reviewed. Constitutional: He is oriented to person, place, and time. He appears well-developed and well-nourished.  Non-toxic appearance. He does not appear ill. No distress.  Watching TV  HENT:  Head: Normocephalic and atraumatic.  Right Ear: External ear normal.  Left Ear: External ear normal.  Nose: Nose normal. No mucosal edema or rhinorrhea.  Mouth/Throat: Oropharynx is clear and moist and mucous membranes are normal. No dental abscesses or uvula swelling.  Eyes: Conjunctivae and EOM are normal. Pupils are equal, round, and reactive to light.  Neck: Normal range of motion and full passive range of motion without pain. Neck supple.  Cardiovascular: Normal rate, regular rhythm and normal heart sounds.  Exam reveals no gallop and no friction rub.   No murmur heard. Pulmonary/Chest: Effort normal and breath sounds normal. No respiratory distress. He has no wheezes. He has no rhonchi. He has no rales. He exhibits no tenderness and no crepitus.  Abdominal:  Soft. Normal appearance and bowel sounds are normal. He exhibits no distension. There is no tenderness. There is no rebound and no guarding.  Musculoskeletal: Normal range of motion. He exhibits no edema and no tenderness.  Moves all extremities well.   Neurological: He is alert and oriented to person, place, and time. He has normal strength. No cranial nerve deficit.  Skin: Skin is warm, dry and intact. No rash noted. No erythema. No pallor.  Psychiatric: He has a normal mood and affect. His speech is normal and behavior is normal. His mood appears not anxious.  Slow affect    ED Course  Procedures  Medications  ondansetron (ZOFRAN-ODT) disintegrating tablet 4 mg (4 mg Oral Given 05/05/13 2216)    DIAGNOSTIC STUDIES: Oxygen Saturation is 94% on RA, adequate by my interpretation.    COORDINATION OF CARE: 10:07 PM-Discussed treatment plan which includes administer Zofran while in ED. Pt agreed to plan.   Pt was able to drink without problems, ready to go home  Labs Review Labs Reviewed - No data to display Imaging Review No results found.  EKG Interpretation   None       MDM   1. Nausea and vomiting    New Prescriptions   ONDANSETRON (ZOFRAN) 4 MG TABLET    Take 1 tablet (4 mg total) by mouth every 8 (eight) hours as needed for nausea or vomiting.    Plan discharge  Rolland Porter, MD, FACEP  I personally performed the services described in this documentation, which was scribed in my presence. The recorded information has been reviewed and considered.  Rolland Porter, MD, Jeremy SanderJanice Norrie, MD 05/05/13 [phone]

## 2013-05-05 NOTE — Discharge Instructions (Signed)
Drink plenty of fluids. Use the zofran for nausea or vomiting. Recheck if you feel worse. Avoid fried, spicy or greasy foods for the next couple of days.    Nausea and Vomiting Nausea is a sick feeling that often comes before throwing up (vomiting). Vomiting is a reflex where stomach contents come out of your mouth. Vomiting can cause severe loss of body fluids (dehydration). Children and elderly adults can become dehydrated quickly, especially if they also have diarrhea. Nausea and vomiting are symptoms of a condition or disease. It is important to find the cause of your symptoms. CAUSES   Direct irritation of the stomach lining. This irritation can result from increased acid production (gastroesophageal reflux disease), infection, food poisoning, taking certain medicines (such as nonsteroidal anti-inflammatory drugs), alcohol use, or tobacco use.  Signals from the brain.These signals could be caused by a headache, heat exposure, an inner ear disturbance, increased pressure in the brain from injury, infection, a tumor, or a concussion, pain, emotional stimulus, or metabolic problems.  An obstruction in the gastrointestinal tract (bowel obstruction).  Illnesses such as diabetes, hepatitis, gallbladder problems, appendicitis, kidney problems, cancer, sepsis, atypical symptoms of a heart attack, or eating disorders.  Medical treatments such as chemotherapy and radiation.  Receiving medicine that makes you sleep (general anesthetic) during surgery. DIAGNOSIS Your caregiver may ask for tests to be done if the problems do not improve after a few days. Tests may also be done if symptoms are severe or if the reason for the nausea and vomiting is not clear. Tests may include:  Urine tests.  Blood tests.  Stool tests.  Cultures (to look for evidence of infection).  X-rays or other imaging studies. Test results can help your caregiver make decisions about treatment or the need for additional  tests. TREATMENT You need to stay well hydrated. Drink frequently but in small amounts.You may wish to drink water, sports drinks, clear broth, or eat frozen ice pops or gelatin dessert to help stay hydrated.When you eat, eating slowly may help prevent nausea.There are also some antinausea medicines that may help prevent nausea. HOME CARE INSTRUCTIONS   Take all medicine as directed by your caregiver.  If you do not have an appetite, do not force yourself to eat. However, you must continue to drink fluids.  If you have an appetite, eat a normal diet unless your caregiver tells you differently.  Eat a variety of complex carbohydrates (rice, wheat, potatoes, bread), lean meats, yogurt, fruits, and vegetables.  Avoid high-fat foods because they are more difficult to digest.  Drink enough water and fluids to keep your urine clear or pale yellow.  If you are dehydrated, ask your caregiver for specific rehydration instructions. Signs of dehydration may include:  Severe thirst.  Dry lips and mouth.  Dizziness.  Dark urine.  Decreasing urine frequency and amount.  Confusion.  Rapid breathing or pulse. SEEK IMMEDIATE MEDICAL CARE IF:   You have blood or brown flecks (like coffee grounds) in your vomit.  You have black or bloody stools.  You have a severe headache or stiff neck.  You are confused.  You have severe abdominal pain.  You have chest pain or trouble breathing.  You do not urinate at least once every 8 hours.  You develop cold or clammy skin.  You continue to vomit for longer than 24 to 48 hours.  You have a fever. MAKE SURE YOU:   Understand these instructions.  Will watch your condition.  Will get help  right away if you are not doing well or get worse. Document Released: 03/22/2005 Document Revised: 06/14/2011 Document Reviewed: 08/19/2010 [facility] Patient Information 2014 [place], [place].

## 2013-05-06 NOTE — ED Notes (Signed)
Has had full can of sprite and no nausea

## 2013-07-17 ENCOUNTER — Encounter (HOSPITAL_COMMUNITY): Payer: Self-pay | Admitting: Emergency Medicine

## 2013-07-17 ENCOUNTER — Emergency Department (HOSPITAL_COMMUNITY)
Admission: EM | Admit: 2013-07-17 | Discharge: 2013-07-17 | Disposition: A | Discharge status: Home / Self Care | Payer: Medicare Other | Attending: Emergency Medicine | Admitting: Emergency Medicine

## 2013-07-17 ENCOUNTER — Emergency Department (HOSPITAL_COMMUNITY): Payer: Medicare Other

## 2013-07-17 DIAGNOSIS — G40909 Epilepsy, unspecified, not intractable, without status epilepticus: Secondary | ICD-10-CM | POA: Insufficient documentation

## 2013-07-17 DIAGNOSIS — Z79899 Other long term (current) drug therapy: Secondary | ICD-10-CM | POA: Insufficient documentation

## 2013-07-17 DIAGNOSIS — R52 Pain, unspecified: Secondary | ICD-10-CM | POA: Insufficient documentation

## 2013-07-17 DIAGNOSIS — Z880 Allergy status to penicillin: Secondary | ICD-10-CM | POA: Insufficient documentation

## 2013-07-17 DIAGNOSIS — G8929 Other chronic pain: Secondary | ICD-10-CM

## 2013-07-17 DIAGNOSIS — M549 Dorsalgia, unspecified: Secondary | ICD-10-CM

## 2013-07-17 DIAGNOSIS — M545 Low back pain, unspecified: Secondary | ICD-10-CM | POA: Insufficient documentation

## 2013-07-17 DIAGNOSIS — M4120 Other idiopathic scoliosis, site unspecified: Secondary | ICD-10-CM | POA: Insufficient documentation

## 2013-07-17 DIAGNOSIS — J45909 Unspecified asthma, uncomplicated: Secondary | ICD-10-CM | POA: Insufficient documentation

## 2013-07-17 DIAGNOSIS — M419 Scoliosis, unspecified: Secondary | ICD-10-CM

## 2013-07-17 HISTORY — DX: Dorsalgia, unspecified: M54.9

## 2013-07-17 MED ORDER — OXYCODONE-ACETAMINOPHEN 5-325 MG PO TABS
1.0000 | ORAL_TABLET | Freq: Once | ORAL | Status: AC
  Administered 2013-07-17: 1 via ORAL
  Filled 2013-07-17: qty 1

## 2013-07-17 MED ORDER — OXYCODONE-ACETAMINOPHEN 5-325 MG PO TABS: 1.0000 | ORAL_TABLET | ORAL | Status: DC | PRN

## 2013-07-17 NOTE — Discharge Instructions (Signed)
Scoliosis Scoliosis is the name given to a spine that curves sideways.Scoliosis can cause twisting of your shoulders, hips, chest, back, and rib cage.  CAUSES  The cause of scoliosis is not always known. It may be caused by a birth defect or by a disease that can cause muscular dysfunction and imbalance, such as cerebral palsy and muscular dystrophy.  RISK FACTORS Having a disease that causes muscle disease or dysfunction. SIGNS AND SYMPTOMS Scoliosis often has no signs or symptoms.If they are present, they may include:  Unequal size of one body side compared to the other (asymmetry).  Visible curvature of the spine.  Pain. The pain may limit physical activity.  Shortness of breath.  Bowel or bladder issues. DIAGNOSIS A skilled health care provider will perform an evaluation. This will involve:  Taking your history.  Performing a physical examination.  Performing neurological exam to detect nerve or muscle function loss.  Range of motion studies on the spine.  X-rays. An MRI may also be obtained. TREATMENT  Treatment varies depending on the nature, extent, and severity of the disease. If the curvature is not great, you may need only observation. A brace may be used to prevent scoliosis from progressing. A brace may also be needed during growth spurts. Physical therapy may be of benefit. Surgery may be required.  HOME CARE INSTRUCTIONS   Your health care provider may suggest exercises to strengthen your muscles. Perform them as directed.  Ask your health care provider before participating in any sports.   If you have been prescribed an orthopedic brace, wear it as instructed by your health care provider. SEEK MEDICAL CARE IF: Your brace causes the skin to become sore (chafe) or is uncomfortable.  SEEK IMMEDIATE MEDICAL CARE IF:  You have back pain that is not relieved by the medicines prescribed by your health care provider.   Your legs feel weak or you lose function  in your legs.  You lose some bowel or bladder control.  Document Released: 03/19/2000 Document Revised: 01/10/2013 Document Reviewed: 11/27/2012 [facility] Patient Information 2014 [place].   Do not drive within 4 hours of taking oxycodone as this will make you drowsy.  Avoid lifting,  Bending,  Twisting or any other activity that worsens your pain over the next week.  Apply an  icepack  to your lower back for 10-15 minutes every 2 hours for the next 2 days.  You should get rechecked if your symptoms are not better over the next 5 days,  Or you develop increased pain,  Weakness in your leg(s) or loss of bladder or bowel function - these are symptoms of a worse injury.

## 2013-07-17 NOTE — ED Notes (Signed)
Pain low back , onset 3 pm  When cooking, Denies any injury.  Has not taken anything for pain.

## 2013-07-17 NOTE — ED Notes (Signed)
Patient complaining of lower middle back pain starting today. States "I used to take pain medication for my back but I ran out a [place] time ago." Patient denies injury today.

## 2013-07-20 NOTE — ED Provider Notes (Signed)
CSN: [id number]     Arrival date & time 07/17/13  1915 History   First MD Initiated Contact with Patient 07/17/13 1937     Chief Complaint  Patient presents with  . Back Pain     (Consider location/radiation/quality/duration/timing/severity/associated sxs/prior Treatment) HPI Comments: Jeremy [name] is a 33 y.o. Male presenting with acute intermittently chronic low back pain which has which he woke with today.   Patient denies any new injury specifically.  He states it has been quite a [place] time since he had back pain,  Used to take prn oxycodone for this problem.  He does not know the source of his pain and does not recall ever having xrays of his back.  There is no radiation into his lower extremities.  There has been no weakness or numbness in the lower extremities and no urinary or bowel retention or incontinence.  Pain is worsened with movement and certain positions such as bending forward.  Patient does not have a history of cancer or IVDU.  The patient has taken ibuprofen without relief of his pain.      The history is provided by the patient and a parent.    Past Medical History  Diagnosis Date  . Seizures   . Bronchiectasis     Chronic, left lower lobe  . Asthma   . Back pain    Past Surgical History  Procedure Laterality Date  . Left lower lobectomy  09/07/2010    Dr Arlyce Dice  . Left thoracotomy, exploration for hemorrhage  09/08/10    Dr Arlyce Dice   History reviewed. No pertinent family history. History  Substance Use Topics  . Smoking status: Never Smoker   . Smokeless tobacco: Never Used  . Alcohol Use: No    Review of Systems  Constitutional: Negative for fever.  Respiratory: Negative for shortness of breath.   Cardiovascular: Negative for chest pain and leg swelling.  Gastrointestinal: Negative for abdominal pain, constipation and abdominal distention.  Genitourinary: Negative for dysuria, urgency, frequency, flank pain and difficulty urinating.  Musculoskeletal:  Positive for back pain. Negative for gait problem and joint swelling.  Skin: Negative for rash.  Neurological: Negative for weakness and numbness.      Allergies  Penicillins  Home Medications   Prior to Admission medications   Medication Sig Start Date End Date Taking? Authorizing Provider  albuterol (PROAIR HFA) 108 (90 BASE) MCG/ACT inhaler Inhale 1 puff into the lungs every 6 (six) hours as needed for wheezing or shortness of breath.   Yes Historical Provider, MD  calcium carbonate (TUMS - DOSED IN MG ELEMENTAL CALCIUM) 500 MG chewable tablet Chew 2 tablets by mouth daily.     Yes Historical Provider, MD  fish oil-omega-3 fatty acids 1000 MG capsule Take 1 g by mouth daily.     Yes Historical Provider, MD  lamoTRIgine (LAMICTAL) 150 MG tablet Take 150 mg by mouth daily.    Yes Historical Provider, MD  topiramate (TOPAMAX) 200 MG tablet Take 200 mg by mouth daily.     Yes Historical Provider, MD  oxyCODONE-acetaminophen (PERCOCET/ROXICET) 5-325 MG per tablet Take 1 tablet by mouth every 4 (four) hours as needed for severe pain. 07/17/13   Evalee Jefferson, PA-C   BP 125/79  Pulse 82  Temp(Src) 98.2 F (36.8 C) (Oral)  Resp 16  Ht 6\' 5"  (1.956 m)  Wt 172 lb (78.019 kg)  BMI 20.39 kg/m2  SpO2 99% Physical Exam  Nursing note and vitals reviewed. Constitutional: He  appears well-developed and well-nourished.  HENT:  Head: Normocephalic.  Eyes: Conjunctivae are normal.  Neck: Normal range of motion. Neck supple.  Cardiovascular: Normal rate and intact distal pulses.   Pedal pulses normal.  Pulmonary/Chest: Effort normal.  Abdominal: Soft. Bowel sounds are normal. He exhibits no distension and no mass.  Musculoskeletal: Normal range of motion. He exhibits no edema.       Lumbar back: He exhibits tenderness and bony tenderness. He exhibits no swelling, no edema and no spasm.  Neurological: He is alert. He has normal strength. He displays no atrophy and no tremor. No sensory deficit.  Gait normal.  Reflex Scores:      Patellar reflexes are 2+ on the right side and 2+ on the left side.      Achilles reflexes are 2+ on the right side and 2+ on the left side. No strength deficit noted in hip and knee flexor and extensor muscle groups.  Ankle flexion and extension intact.  Skin: Skin is warm and dry.  Psychiatric: He has a normal mood and affect.    ED Course  Procedures (including critical care time) Labs Review Labs Reviewed - No data to display  Imaging Review No results found.   EKG Interpretation None      MDM   Final diagnoses:  Chronic back pain  Scoliosis    Patients labs and/or radiological studies were viewed and considered during the medical decision making and disposition process. xrays reviewed with patient and his mother who presented with the patient.  Oxycodone prescribed,  Encouraged to continue taking ibuprofen,  Heat tx.  F/u with pcp  If sx worsen or are not improving over the next week.    The patient appears reasonably screened and/or stabilized for discharge and I doubt any other medical condition or other [facility] requiring further screening, evaluation, or treatment in the ED at this time prior to discharge.     Evalee Jefferson, PA-C 07/20/13 1704

## 2013-07-22 NOTE — ED Provider Notes (Signed)
Medical screening examination/treatment/procedure(s) were performed by non-physician practitioner and as supervising physician I was immediately available for consultation/collaboration.  Babette Relic, MD 07/22/13 [id number]

## 2013-09-19 ENCOUNTER — Other Ambulatory Visit (HOSPITAL_COMMUNITY): Payer: Self-pay | Admitting: Internal Medicine

## 2013-09-19 DIAGNOSIS — R109 Unspecified abdominal pain: Secondary | ICD-10-CM

## 2013-09-20 ENCOUNTER — Other Ambulatory Visit (HOSPITAL_COMMUNITY): Payer: Self-pay | Admitting: Internal Medicine

## 2013-09-20 DIAGNOSIS — R109 Unspecified abdominal pain: Secondary | ICD-10-CM

## 2013-09-21 ENCOUNTER — Ambulatory Visit (HOSPITAL_COMMUNITY)
Admission: RE | Admit: 2013-09-21 | Discharge: 2013-09-21 | Disposition: A | Discharge status: Home / Self Care | Payer: Medicare Other | Source: Ambulatory Visit | Attending: Internal Medicine | Admitting: Internal Medicine

## 2013-09-21 DIAGNOSIS — R109 Unspecified abdominal pain: Secondary | ICD-10-CM | POA: Insufficient documentation

## 2013-09-21 MED ORDER — IOHEXOL 300 MG/ML  SOLN
100.0000 mL | Freq: Once | INTRAMUSCULAR | Status: AC | PRN
  Administered 2013-09-21: 100 mL via INTRAVENOUS

## 2013-12-10 ENCOUNTER — Emergency Department (HOSPITAL_COMMUNITY)
Admission: EM | Admit: 2013-12-10 | Discharge: 2013-12-10 | Disposition: A | Discharge status: Home / Self Care | Payer: Medicare Other | Attending: Emergency Medicine | Admitting: Emergency Medicine

## 2013-12-10 ENCOUNTER — Encounter (HOSPITAL_COMMUNITY): Payer: Self-pay | Admitting: Emergency Medicine

## 2013-12-10 DIAGNOSIS — Z79899 Other long term (current) drug therapy: Secondary | ICD-10-CM | POA: Diagnosis not present

## 2013-12-10 DIAGNOSIS — K5900 Constipation, unspecified: Secondary | ICD-10-CM | POA: Insufficient documentation

## 2013-12-10 DIAGNOSIS — Z880 Allergy status to penicillin: Secondary | ICD-10-CM | POA: Diagnosis not present

## 2013-12-10 DIAGNOSIS — J45901 Unspecified asthma with (acute) exacerbation: Secondary | ICD-10-CM | POA: Diagnosis not present

## 2013-12-10 DIAGNOSIS — M545 Low back pain, unspecified: Secondary | ICD-10-CM | POA: Diagnosis not present

## 2013-12-10 DIAGNOSIS — G40909 Epilepsy, unspecified, not intractable, without status epilepticus: Secondary | ICD-10-CM | POA: Diagnosis not present

## 2013-12-10 DIAGNOSIS — Z902 Acquired absence of lung [part of]: Secondary | ICD-10-CM | POA: Insufficient documentation

## 2013-12-10 DIAGNOSIS — M549 Dorsalgia, unspecified: Secondary | ICD-10-CM | POA: Insufficient documentation

## 2013-12-10 MED ORDER — DICLOFENAC SODIUM 75 MG PO TBEC: 75.0000 mg | DELAYED_RELEASE_TABLET | Freq: Two times a day (BID) | ORAL | Status: DC

## 2013-12-10 MED ORDER — METHOCARBAMOL 500 MG PO TABS: 500.0000 mg | ORAL_TABLET | Freq: Three times a day (TID) | ORAL | Status: DC

## 2013-12-10 NOTE — ED Provider Notes (Signed)
Medical screening examination/treatment/procedure(s) were performed by non-physician practitioner and as supervising physician I was immediately available for consultation/collaboration.   Dorie Rank, MD 12/10/13 2237

## 2013-12-10 NOTE — ED Provider Notes (Signed)
CSN: [id number]     Arrival date & time 12/10/13  1700 History   First MD Initiated Contact with Patient 12/10/13 1715   This chart was scribed for non-physician practitioner Lily Kocher, PA-C, working with Dorie Rank, MD by Rosary Lively, ED scribe. This patient was seen in room APFT22/APFT22 and the patient's care was started at 5:17 PM.    Chief Complaint  Patient presents with  . Back Pain    Patient is a 33 y.o. male presenting with back pain. The history is provided by the patient. No language interpreter was used.  Back Pain Location:  Generalized Quality: spasms. Pain severity:  Severe Timing:  Intermittent  HPI Comments:  Jeremy [name] is a 33 y.o. male who presents to the Emergency Department complaining of lower back pain, with associated symptoms of spasms, that worsened today. Mother reports that pt had a lung operation in 2012, and has experienced intermitent back pain since then. Mother reports that in 07/2013 it was determined that there is a location on his spine that is also "crooked." Mother reports that the muscle spasms cause him to have near falls, along with SOB. Pt reports that he is unsure which activities exacerbate back spasms. PCP is Dr. Karie Kirks.   Past Medical History  Diagnosis Date  . Seizures   . Bronchiectasis     Chronic, left lower lobe  . Asthma   . Back pain    Past Surgical History  Procedure Laterality Date  . Left lower lobectomy  09/07/2010    Dr Arlyce Dice  . Left thoracotomy, exploration for hemorrhage  09/08/10    Dr Arlyce Dice   History reviewed. No pertinent family history. History  Substance Use Topics  . Smoking status: Never Smoker   . Smokeless tobacco: Never Used  . Alcohol Use: No    Review of Systems  Gastrointestinal: Positive for constipation.  Musculoskeletal: Positive for back pain.  Neurological: Positive for seizures.  All other systems reviewed and are negative.   Allergies  Penicillins  Home Medications   Prior to  Admission medications   Medication Sig Start Date End Date Taking? Authorizing Provider  acetaminophen (TYLENOL) 500 MG tablet Take 500 mg by mouth every 6 (six) hours as needed for mild pain, moderate pain or headache.   Yes Historical Provider, MD  albuterol (PROAIR HFA) 108 (90 BASE) MCG/ACT inhaler Inhale 1 puff into the lungs every 6 (six) hours as needed for wheezing or shortness of breath.   Yes Historical Provider, MD  fish oil-omega-3 fatty acids 1000 MG capsule Take 1 g by mouth daily.     Yes Historical Provider, MD  lamoTRIgine (LAMICTAL) 150 MG tablet Take 150 mg by mouth daily.    Yes Historical Provider, MD  topiramate (TOPAMAX) 200 MG tablet Take 200 mg by mouth daily.     Yes Historical Provider, MD   BP 127/83  Pulse 87  Temp(Src) 98.7 F (37.1 C) (Oral)  Resp 18  Ht 6\' 6"  (1.981 m)  Wt 165 lb 6 oz (75.014 kg)  BMI 19.11 kg/m2  SpO2 99% Physical Exam  Nursing note and vitals reviewed. Constitutional: He is oriented to person, place, and time. He appears well-developed and well-nourished.  HENT:  Head: Normocephalic and atraumatic.  Eyes: EOM are normal.  Neck: Normal range of motion. Neck supple.  Cardiovascular: Normal rate.   Pulmonary/Chest: Effort normal.  Musculoskeletal: Normal range of motion.  Mild muscle tightness with ROM in lumbar area.   Neurological: He  is alert and oriented to person, place, and time.  Balance was good. No motor strength deceits. No muscle atrophy of the lower extremity.   Skin: Skin is warm and dry.  Psychiatric: He has a normal mood and affect. His behavior is normal.    ED Course Xray from April and June of 2015 reviewed by me.  Procedures  DIAGNOSTIC STUDIES: Oxygen Saturation is 99% on RA, normal by my interpretation.  COORDINATION OF CARE: 5:29 PM-Discussed treatment plan which includes *muscle relax, and diclofenac with pt at bedside and pt agreed to plan.  Labs Review Labs Reviewed - No data to display  Imaging  Review No results found.   EKG Interpretation None      MDM No gross neuro deficit. No injury or trauma involving the back. Suspect muscle spasm and hx of scoliosis.  Rx for robaxin and diclofenac given to the patient.   Final diagnoses:  None    Reviewed x-rays from from 07/2013 and 09/2013  *I have reviewed nursing notes, vital signs, and all appropriate lab and imaging results for this patient.**  **I personally performed the services described in this documentation, which was scribed in my presence. The recorded information has been reviewed and is accurate.Lenox Ahr, PA-C 12/10/13 1750

## 2013-12-10 NOTE — ED Notes (Signed)
Pt reports lower back pain since Saturday. Pt reports seen for same previously and diagnosed with a slipped disc. Pt denies any new or recent injury. nad noted.

## 2013-12-10 NOTE — Discharge Instructions (Signed)
Warm tub soaks may be helpful. Please use diclofenac and robaxin as suggested. Robaxin may cause drowsiness, use with caution.  Lumbosacral Strain Lumbosacral strain is a strain of any of the parts that make up your lumbosacral vertebrae. Your lumbosacral vertebrae are the bones that make up the lower third of your backbone. Your lumbosacral vertebrae are held together by muscles and tough, fibrous tissue (ligaments).  CAUSES  A sudden blow to your back can cause lumbosacral strain. Also, anything that causes an excessive stretch of the muscles in the low back can cause this strain. This is typically seen when people exert themselves strenuously, fall, lift heavy objects, bend, or crouch repeatedly. RISK FACTORS  Physically demanding work.  Participation in pushing or pulling sports or sports that require a sudden twist of the back (tennis, golf, baseball).  Weight lifting.  Excessive lower back curvature.  Forward-tilted pelvis.  Weak back or abdominal muscles or both.  Tight hamstrings. SIGNS AND SYMPTOMS  Lumbosacral strain may cause pain in the area of your injury or pain that moves (radiates) down your leg.  DIAGNOSIS Your health care provider can often diagnose lumbosacral strain through a physical exam. In some cases, you may need tests such as X-ray exams.  TREATMENT  Treatment for your lower back injury depends on many factors that your clinician will have to evaluate. However, most treatment will include the use of anti-inflammatory medicines. HOME CARE INSTRUCTIONS   Avoid hard physical activities (tennis, racquetball, waterskiing) if you are not in proper physical condition for it. This may aggravate or create problems.  If you have a back problem, avoid sports requiring sudden body movements. Swimming and walking are generally safer activities.  Maintain good posture.  Maintain a healthy weight.  For acute conditions, you may put ice on the injured area.  Put ice in  a plastic bag.  Place a towel between your skin and the bag.  Leave the ice on for 20 minutes, 2-3 times a day.  When the low back starts healing, stretching and strengthening exercises may be recommended. SEEK MEDICAL CARE IF:  Your back pain is getting worse.  You experience severe back pain not relieved with medicines. SEEK IMMEDIATE MEDICAL CARE IF:   You have numbness, tingling, weakness, or problems with the use of your arms or legs.  There is a change in bowel or bladder control.  You have increasing pain in any area of the body, including your belly (abdomen).  You notice shortness of breath, dizziness, or feel faint.  You feel sick to your stomach (nauseous), are throwing up (vomiting), or become sweaty.  You notice discoloration of your toes or legs, or your feet get very cold. MAKE SURE YOU:   Understand these instructions.  Will watch your condition.  Will get help right away if you are not doing well or get worse. Document Released: 12/30/2004 Document Revised: 03/27/2013 Document Reviewed: 11/08/2012 [facility] Patient Information 2015 [place], [place]. This information is not intended to replace advice given to you by your health care provider. Make sure you discuss any questions you have with your health care provider. Please keep a record of back symptoms for your doctor.

## 2014-09-07 ENCOUNTER — Encounter (HOSPITAL_COMMUNITY): Payer: Self-pay | Admitting: *Deleted

## 2014-09-07 ENCOUNTER — Emergency Department (HOSPITAL_COMMUNITY)
Admission: EM | Admit: 2014-09-07 | Discharge: 2014-09-07 | Disposition: A | Discharge status: Home / Self Care | Payer: Medicare Other | Attending: Emergency Medicine | Admitting: Emergency Medicine

## 2014-09-07 DIAGNOSIS — Y9289 Other specified places as the place of occurrence of the external cause: Secondary | ICD-10-CM | POA: Diagnosis not present

## 2014-09-07 DIAGNOSIS — G40909 Epilepsy, unspecified, not intractable, without status epilepticus: Secondary | ICD-10-CM | POA: Diagnosis not present

## 2014-09-07 DIAGNOSIS — X58XXXA Exposure to other specified factors, initial encounter: Secondary | ICD-10-CM | POA: Diagnosis not present

## 2014-09-07 DIAGNOSIS — Y9389 Activity, other specified: Secondary | ICD-10-CM | POA: Diagnosis not present

## 2014-09-07 DIAGNOSIS — Z880 Allergy status to penicillin: Secondary | ICD-10-CM | POA: Diagnosis not present

## 2014-09-07 DIAGNOSIS — J45909 Unspecified asthma, uncomplicated: Secondary | ICD-10-CM | POA: Diagnosis not present

## 2014-09-07 DIAGNOSIS — Y998 Other external cause status: Secondary | ICD-10-CM | POA: Diagnosis not present

## 2014-09-07 DIAGNOSIS — Z79899 Other long term (current) drug therapy: Secondary | ICD-10-CM | POA: Diagnosis not present

## 2014-09-07 DIAGNOSIS — T672XXA Heat cramp, initial encounter: Secondary | ICD-10-CM

## 2014-09-07 DIAGNOSIS — R000 Tachycardia, unspecified: Secondary | ICD-10-CM | POA: Insufficient documentation

## 2014-09-07 DIAGNOSIS — Z791 Long term (current) use of non-steroidal anti-inflammatories (NSAID): Secondary | ICD-10-CM | POA: Diagnosis not present

## 2014-09-07 DIAGNOSIS — M62830 Muscle spasm of back: Secondary | ICD-10-CM | POA: Diagnosis present

## 2014-09-07 LAB — BASIC METABOLIC PANEL
ANION GAP: 6 (ref 5–15)
BUN: 16 mg/dL (ref 6–20)
CALCIUM: 9.5 mg/dL (ref 8.9–10.3)
CHLORIDE: 107 mmol/L (ref 101–111)
CO2: 26 mmol/L (ref 22–32)
CREATININE: 1.27 mg/dL — AB (ref 0.61–1.24)
GFR calc Af Amer: >60 mL/min (ref >60)
GLUCOSE: 99 mg/dL (ref 65–99)
Potassium: 3.8 mmol/L (ref 3.5–5.1)
SODIUM: 139 mmol/L (ref 135–145)

## 2014-09-07 LAB — CK: Total CK: 133 U/L (ref 49–397)

## 2014-09-07 NOTE — Discharge Instructions (Signed)
The cramp or spasm in your back may have been related to excessive heat. Please drink 6 glasses of water and/or Gatorade daily Heat-Related Illness Heat-related illnesses occur when the body is unable to properly cool itself. The body normally cools itself by sweating. However, under some conditions sweating is not enough. In these cases, a person's body temperature rises rapidly. Very high body temperatures may damage the brain or other vital organs. Some examples of heat-related illnesses include:  Heat stroke. This occurs when the body is unable to regulate its temperature. The body's temperature rises rapidly, the sweating mechanism fails, and the body is unable to cool down. Body temperature may rise to 106 F (41 C) or higher within 10 to 15 minutes. Heat stroke can cause death or permanent disability if emergency treatment is not provided.  Heat exhaustion. This is a milder form of heat-related illness that can develop after several days of exposure to high temperatures and not enough fluids. It is the body's response to an excessive loss of the water and salt contained in sweat.  Heat cramps. These usually affect people who sweat a lot during heavy activity. This sweating drains the body's salt and moisture. The low salt level in the muscles causes painful cramps. Heat cramps may also be a symptom of heat exhaustion. Heat cramps usually occur in the abdomen, arms, or legs. Get medical attention for cramps if you have heart problems or are on a low-sodium diet. Those that are at greatest risk for heat-related illnesses include:   The elderly.  Infant and the very young.  People with mental illness and chronic diseases.  People who are overweight (obese).  Young and healthy people can even succumb to heat if they participate in strenuous physical activities during hot weather. CAUSES  Several factors affect the body's ability to cool itself during extremely hot weather. When the humidity  is high, sweat will not evaporate as quickly. This prevents the body from releasing heat quickly. Other factors that can affect the body's ability to cool down include:   Age.  Obesity.  Fever.  Dehydration.  Heart disease.  Mental illness.  Poor circulation.  Sunburn.  Prescription drug use.  Alcohol use. SYMPTOMS  Heat stroke: Warning signs of heat stroke vary, but may include:  An extremely high body temperature (above 103F orally).  A fast, strong pulse.  Dizziness.  Confusion.  Red, hot, and dry skin.  No sweating.  Throbbing headache.  Feeling sick to your stomach (nauseous).  Unconsciousness. Heat exhaustion: Warning signs of heat exhaustion include:  Heavy sweating.  Tiredness.  Headache.  Paleness.  Weakness.  Feeling sick to your stomach (nauseous) or vomiting.  Muscle cramps. Heat cramps  Muscle pains or spasms. TREATMENT  Heat stroke  Get into a cool environment. An indoor place that is air-conditioned may be best.  Take a cool shower or bath. Have someone around to make sure you are okay.  Take your temperature. Make sure it is going down. Heat exhaustion  Drink plenty of fluids. Do not drink liquids that contain caffeine, alcohol, or large amounts of sugar. These cause you to lose more body fluid. Also, avoid very cold drinks. They can cause stomach cramps.  Get into a cool environment. An indoor place that is air-conditioned may be best.  Take a cool shower or bath. Have someone around to make sure you are okay.  Put on lightweight clothing. Heat cramps  Stop whatever activity you were doing. Do not attempt  to do that activity for at least 3 hours after the cramps have gone away.  Get into a cool environment. An indoor place that is air-conditioned may be best. [place]  To protect your health when temperatures are extremely high, follow these tips:  During heavy exercise in a hot environment, drink two  to four glasses (16-32 ounces) of cool fluids each hour. Do not wait until you are thirsty to drink. Warning: If your caregiver limits the amount of fluid you drink or has you on water pills, ask how much you should drink while the weather is hot.  Do not drink liquids that contain caffeine, alcohol, or large amounts of sugar. These cause you to lose more body fluid.  Avoid very cold drinks. They can cause stomach cramps.  Wear appropriate clothing. Choose lightweight, light-colored, loose-fitting clothing.  If you must be outdoors, try to limit your outdoor activity to morning and evening hours. Try to rest often in shady areas.  If you are not used to working or exercising in a hot environment, start slowly and pick up the pace gradually.  Stay cool in an air-conditioned place if possible. If your home does not have air conditioning, go to the shopping mall or [employer].  Taking a cool shower or bath may help you cool off. SEEK MEDICAL CARE IF:   You see any of the symptoms listed above. You may be dealing with a life-threatening emergency.  Symptoms worsen or last longer than 1 hour.  Heat cramps do not get better in 1 hour. MAKE SURE YOU:   Understand these instructions.  Will watch your condition.  Will get help right away if you are not doing well or get worse. Document Released: 12/30/2007 Document Revised: 06/14/2011 Document Reviewed: 12/30/2007 [facility] Patient Information 2015 [place], [place]. This information is not intended to replace advice given to you by your health care provider. Make sure you discuss any questions you have with your health care provider. . Please see Dr. Karie Kirks next week to recheck your creatinine. It was elevated at 1.27.

## 2014-09-07 NOTE — ED Notes (Signed)
Pt states back spasms began this morning. Pt states this has happened before in the past when he got too hot. Pt states he was outside in the heat today. NAD>

## 2014-09-07 NOTE — ED Notes (Signed)
PA Hobson at bedside.

## 2014-09-07 NOTE — ED Provider Notes (Signed)
CSN: [id number]     Arrival date & time 09/07/14  1845 History   First MD Initiated Contact with Patient 09/07/14 1921     Chief Complaint  Patient presents with  . Spasms     (Consider location/radiation/quality/duration/timing/severity/associated sxs/prior Treatment) HPI Comments: Patient is a 34 year old male who presents to the emergency department with complaint of back spasms. The patient states that he has been working outside over the last couple of days. He was outside in the heat today. He noticed that he had a spasm involving his back this morning, it went away but came back. The patient's mother states she has been encouraged him to come to the emergency department for evaluation. The patient states that he has been drinking water, but drinks mostly at night. He states he's also been drinking some juices, he is not sure if he's been keeping up with his liquids during the day. There was no vomiting reported. Patient did not measure a temperature during the day today. There was no cramping other than in the back.  The history is provided by the patient.    Past Medical History  Diagnosis Date  . Seizures   . Bronchiectasis     Chronic, left lower lobe  . Asthma   . Back pain    Past Surgical History  Procedure Laterality Date  . Left lower lobectomy  09/07/2010    Dr Arlyce Dice  . Left thoracotomy, exploration for hemorrhage  09/08/10    Dr Arlyce Dice   No family history on file. History  Substance Use Topics  . Smoking status: Never Smoker   . Smokeless tobacco: Never Used  . Alcohol Use: No    Review of Systems  Constitutional: Negative for chills, activity change and appetite change.  Musculoskeletal: Positive for myalgias and back pain.  Neurological: Positive for seizures.  All other systems reviewed and are negative.     Allergies  Penicillins  Home Medications   Prior to Admission medications   Medication Sig Start Date End Date Taking? Authorizing Provider   albuterol (PROAIR HFA) 108 (90 BASE) MCG/ACT inhaler Inhale 1 puff into the lungs every 6 (six) hours as needed for wheezing or shortness of breath.   Yes Historical Provider, MD  cyclobenzaprine (FLEXERIL) 10 MG tablet Take 10 mg by mouth 3 (three) times daily.  07/02/14  Yes Historical Provider, MD  fish oil-omega-3 fatty acids 1000 MG capsule Take 1 g by mouth daily.     Yes Historical Provider, MD  lamoTRIgine (LAMICTAL) 150 MG tablet Take 150 mg by mouth daily.    Yes Historical Provider, MD  topiramate (TOPAMAX) 200 MG tablet Take 200 mg by mouth daily.     Yes Historical Provider, MD  diclofenac (VOLTAREN) 75 MG EC tablet Take 1 tablet (75 mg total) by mouth 2 (two) times daily. Patient not taking: Reported on 09/07/2014 12/10/13   Lily Kocher, PA-C  methocarbamol (ROBAXIN) 500 MG tablet Take 1 tablet (500 mg total) by mouth 3 (three) times daily. Patient not taking: Reported on 09/07/2014 12/10/13   Lily Kocher, PA-C   BP 134/82 mmHg  Pulse 100  Temp(Src) 99.9 F (37.7 C) (Oral)  Resp 18  SpO2 97% Physical Exam  Constitutional: He is oriented to person, place, and time. He appears well-developed and well-nourished.  Non-toxic appearance. He does not have a sickly appearance. He does not appear ill. No distress.  HENT:  Head: Normocephalic.  Right Ear: Tympanic membrane and external ear normal.  Left  Ear: Tympanic membrane and external ear normal.  Eyes: EOM and lids are normal. Pupils are equal, round, and reactive to light.  Neck: Normal range of motion. Neck supple. Carotid bruit is not present.  Cardiovascular: Regular rhythm, normal heart sounds, intact distal pulses and normal pulses.  Tachycardia present.   Tachycardia at 100 bpm  Pulmonary/Chest: Breath sounds normal. No respiratory distress.  Abdominal: Soft. Bowel sounds are normal. There is no tenderness. There is no guarding.  Musculoskeletal: Normal range of motion.  Lymphadenopathy:       Head (right side): No  submandibular adenopathy present.       Head (left side): No submandibular adenopathy present.    He has no cervical adenopathy.  Neurological: He is alert and oriented to person, place, and time. He has normal strength. No cranial nerve deficit or sensory deficit.  Skin: Skin is warm and dry.  Psychiatric: He has a normal mood and affect. His speech is normal.  Nursing note and vitals reviewed.   ED Course  Procedures (including critical care time) Labs Review Labs Reviewed - No data to display  Imaging Review No results found.   EKG Interpretation None      MDM  On admission to the emergency room, the temperature is 99.9, the pulse rate is 100. The blood pressure is normal at 134/82. The pulse oximetry is 97% on room air. Within normal limits by my interpretation. The patient is awake and alert and in no distress.   Creat 1.27, elevated from previous ED visits. Pt and mother state pt walked at least1.5 to 2 miles in the heat yesterday and today. Will check CK level.  CK normal at 133.  The plan at this time is for the patient to increase water and Gatorade, up to 6 glasses daily. The patient is to see his primary physician next week to have his creatinine checked. The patient is to return to the emergency department if any changes, problems, or concerns.    Final diagnoses:  Heat cramp, initial encounter    *I have reviewed nursing notes, vital signs, and all appropriate lab and imaging results for this patient.[address], PA-C 09/07/14 2205  Tanna Furry, MD 09/18/14 [phone]

## 2014-09-07 NOTE — ED Notes (Signed)
Applied ice packs to pt's bilateral axilla and to pt's back. Gave pt water to drink per PA Lily Kocher.

## 2014-11-25 ENCOUNTER — Emergency Department (HOSPITAL_COMMUNITY): Payer: Medicare Other

## 2014-11-25 ENCOUNTER — Emergency Department (HOSPITAL_COMMUNITY)
Admission: EM | Admit: 2014-11-25 | Discharge: 2014-11-25 | Disposition: A | Discharge status: Home / Self Care | Payer: Medicare Other | Attending: Emergency Medicine | Admitting: Emergency Medicine

## 2014-11-25 ENCOUNTER — Encounter (HOSPITAL_COMMUNITY): Payer: Self-pay | Admitting: *Deleted

## 2014-11-25 DIAGNOSIS — M549 Dorsalgia, unspecified: Secondary | ICD-10-CM | POA: Diagnosis present

## 2014-11-25 DIAGNOSIS — M545 Low back pain, unspecified: Secondary | ICD-10-CM

## 2014-11-25 DIAGNOSIS — G40909 Epilepsy, unspecified, not intractable, without status epilepticus: Secondary | ICD-10-CM | POA: Diagnosis not present

## 2014-11-25 DIAGNOSIS — J45909 Unspecified asthma, uncomplicated: Secondary | ICD-10-CM | POA: Diagnosis not present

## 2014-11-25 DIAGNOSIS — Z880 Allergy status to penicillin: Secondary | ICD-10-CM | POA: Insufficient documentation

## 2014-11-25 DIAGNOSIS — Z79899 Other long term (current) drug therapy: Secondary | ICD-10-CM | POA: Insufficient documentation

## 2014-11-25 LAB — URINE MICROSCOPIC-ADD ON

## 2014-11-25 LAB — URINALYSIS, ROUTINE W REFLEX MICROSCOPIC
BILIRUBIN URINE: NEGATIVE
Glucose, UA: NEGATIVE mg/dL
Ketones, ur: NEGATIVE mg/dL
Leukocytes, UA: NEGATIVE
NITRITE: NEGATIVE
PROTEIN: NEGATIVE mg/dL
SPECIFIC GRAVITY, URINE: 1.01 (ref 1.005–1.030)
UROBILINOGEN UA: 0.2 mg/dL (ref 0.0–1.0)
pH: 6.5 (ref 5.0–8.0)

## 2014-11-25 MED ORDER — NAPROXEN 500 MG PO TABS: 500.0000 mg | ORAL_TABLET | Freq: Two times a day (BID) | ORAL | Status: DC | PRN

## 2014-11-25 NOTE — ED Provider Notes (Signed)
History  This chart was scribed for non-physician practitioner, Evalee Jefferson, PA-C,working with Virgel Manifold, MD, by Marlowe Kays, ED Scribe. This patient was seen in room APFT20/APFT20 and the patient's care was started at 6:38 PM.  Chief Complaint  Patient presents with  . Back Pain   The history is provided by the patient and medical records. No language interpreter was used.    HPI Comments:  Jeremy [name] is a 34 y.o. male with PMHx of scoliosis, asthma, bronchiectasis and cerebral palsy brought in by EMS, who presents to the Emergency Department complaining of moderate lower back pain that began earlier today.  He spent a fair amount of time sitting in a car as his family ran errands and believes this may be the source of his pain, although states he gets back pain when he gets hot as well.Marland Kitchen He states his mother took his temperature earlier today and he states he had a fever Tmax 99.5 degrees. Pt reports dry cough due to allergies. He states he used his albuterol MDI PTA but normally does not use it daily.  He has taken Flexeril for the pain prior to arrival and now reports his pain is better. He denies modifying factors. He denies sore throat, abdominal pain, SOB, nausea, vomiting or dysuria. He denies any current back pain. PCP is Dr. Karie Kirks. Denies personal h/o kidney stones. He denies any trauma, injury or fall.  Past Medical History  Diagnosis Date  . Bronchiectasis     Chronic, left lower lobe  . Asthma   . Back pain   . Seizures    Past Surgical History  Procedure Laterality Date  . Left lower lobectomy  09/07/2010    Dr Arlyce Dice  . Left thoracotomy, exploration for hemorrhage  09/08/10    Dr Arlyce Dice   History reviewed. No pertinent family history. Social History  Substance Use Topics  . Smoking status: Never Smoker   . Smokeless tobacco: Never Used  . Alcohol Use: No    Review of Systems  Constitutional: Negative for fever and chills.  Respiratory: Positive for  cough. Negative for shortness of breath.   Cardiovascular: Negative for chest pain and leg swelling.  Gastrointestinal: Negative for nausea, vomiting, abdominal pain, constipation and abdominal distention.  Genitourinary: Negative for dysuria, urgency, frequency, flank pain and difficulty urinating.  Musculoskeletal: Positive for back pain. Negative for joint swelling and gait problem.  Skin: Negative for color change, rash and wound.  Neurological: Negative for weakness and numbness.    Allergies  Penicillins  Home Medications   Prior to Admission medications   Medication Sig Start Date End Date Taking? Authorizing Provider  albuterol (PROAIR HFA) 108 (90 BASE) MCG/ACT inhaler Inhale 1 puff into the lungs every 6 (six) hours as needed for wheezing or shortness of breath.   Yes Historical Provider, MD  cyclobenzaprine (FLEXERIL) 10 MG tablet Take 10 mg by mouth 3 (three) times daily.  07/02/14  Yes Historical Provider, MD  fish oil-omega-3 fatty acids 1000 MG capsule Take 1 g by mouth daily.     Yes Historical Provider, MD  lamoTRIgine (LAMICTAL) 150 MG tablet Take 150 mg by mouth daily.    Yes Historical Provider, MD  topiramate (TOPAMAX) 200 MG tablet Take 200 mg by mouth daily.     Yes Historical Provider, MD   Triage Vitals: BP 110/66 mmHg  Pulse 98  Temp(Src) 99.1 F (37.3 C) (Oral)  Resp 18  Ht '6\' 3"'$  (1.905 m)  Wt 165 lb  4.8 oz (74.98 kg)  BMI 20.66 kg/m2  SpO2 99% Physical Exam  Constitutional: He appears well-developed and well-nourished.  HENT:  Head: Normocephalic and atraumatic.  Eyes: Conjunctivae are normal.  Neck: Normal range of motion.  Cardiovascular: Normal rate, regular rhythm, normal heart sounds and intact distal pulses.   Pulmonary/Chest: Effort normal and breath sounds normal. He has no wheezes.  Abdominal: Soft. Bowel sounds are normal. He exhibits no distension. There is no tenderness. There is no guarding and no CVA tenderness.  Musculoskeletal: Normal  range of motion.  Lumbar spine nontender.  Neurological: He is alert.  Skin: Skin is warm and dry.  Psychiatric: He has a normal mood and affect.  Nursing note and vitals reviewed.   ED Course  Procedures (including critical care time) DIAGNOSTIC STUDIES: Oxygen Saturation is 99% on RA, normal by my interpretation.   COORDINATION OF CARE: 6:18 PM- Will check urinalysis. Will order CXR. Pt verbalizes understanding and agrees to plan.  Medications - No data to display  Labs Review Labs Reviewed  URINALYSIS, ROUTINE W REFLEX MICROSCOPIC (NOT AT [facility]) - Abnormal; Notable for the following:    Hgb urine dipstick TRACE (*)    All other components within normal limits  URINE MICROSCOPIC-ADD ON    Imaging Review Dg Chest 2 View  11/25/2014   CLINICAL DATA:  Cough, back pain and fever  EXAM: CHEST  2 VIEW  COMPARISON:  08/27/2012  FINDINGS: The heart size and mediastinal contours are within normal limits. Both lungs are clear. Healed left posterior rib fracture noted.  IMPRESSION: No active cardiopulmonary disease.   Electronically Signed   By: Kerby Moors M.D.   On: 11/25/2014 19:43   I have personally reviewed and evaluated these images and lab results as part of my medical decision-making.   EKG Interpretation None      MDM   Final diagnoses:  Midline low back pain without sciatica    Patients labs reviewed.  Radiological studies were viewed, interpreted and considered during the medical decision making and disposition process. I agree with radiologists reading.  Results were also discussed with patient and mother.  Advised continue using flexeril if needed, naproxen added.  F/u with pcp prn.  The patient appears reasonably screened and/or stabilized for discharge and I doubt any other medical condition or other [facility] requiring further screening, evaluation, or treatment in the ED at this time prior to discharge.    I personally performed the services described in this  documentation, which was scribed in my presence. The recorded information has been reviewed and is accurate.    Evalee Jefferson, PA-C 11/27/14 [place], MD 11/27/14 [phone]

## 2014-11-25 NOTE — ED Notes (Signed)
Patient reports low back pain that started today, denies injury.

## 2014-11-25 NOTE — Discharge Instructions (Signed)
Back Pain, Adult Low back pain is very common. About 1 in 5 people have back pain.The cause of low back pain is rarely dangerous. The pain often gets better over time.About half of people with a sudden onset of back pain feel better in just 2 weeks. About 8 in 10 people feel better by 6 weeks.  CAUSES Some common causes of back pain include:  Strain of the muscles or ligaments supporting the spine.  Wear and tear (degeneration) of the spinal discs.  Arthritis.  Direct injury to the back. DIAGNOSIS Most of the time, the direct cause of low back pain is not known.However, back pain can be treated effectively even when the exact cause of the pain is unknown.Answering your caregiver's questions about your overall health and symptoms is one of the most accurate ways to make sure the cause of your pain is not dangerous. If your caregiver needs more information, he or she may order lab work or imaging tests (X-rays or MRIs).However, even if imaging tests show changes in your back, this usually does not require surgery. HOME CARE INSTRUCTIONS For many people, back pain returns.Since low back pain is rarely dangerous, it is often a condition that people can learn to manageon their own.   Remain active. It is stressful on the back to sit or stand in one place. Do not sit, drive, or stand in one place for more than 30 minutes at a time. Take short walks on level surfaces as soon as pain allows.Try to increase the length of time you walk each day.  Do not stay in bed.Resting more than 1 or 2 days can delay your recovery.  Do not avoid exercise or work.Your body is made to move.It is not dangerous to be active, even though your back may hurt.Your back will likely heal faster if you return to being active before your pain is gone.  Pay attention to your body when you bend and lift. Many people have less discomfortwhen lifting if they bend their knees, keep the load close to their bodies,and  avoid twisting. Often, the most comfortable positions are those that put less stress on your recovering back.  Find a comfortable position to sleep. Use a firm mattress and lie on your side with your knees slightly bent. If you lie on your back, put a pillow under your knees.  Only take over-the-counter or prescription medicines as directed by your caregiver. Over-the-counter medicines to reduce pain and inflammation are often the most helpful.Your caregiver may prescribe muscle relaxant drugs.These medicines help dull your pain so you can more quickly return to your normal activities and healthy exercise.  Put ice on the injured area.  Put ice in a plastic bag.  Place a towel between your skin and the bag.  Leave the ice on for 15-20 minutes, 03-04 times a day for the first 2 to 3 days. After that, ice and heat may be alternated to reduce pain and spasms.  Ask your caregiver about trying back exercises and gentle massage. This may be of some benefit.  Avoid feeling anxious or stressed.Stress increases muscle tension and can worsen back pain.It is important to recognize when you are anxious or stressed and learn ways to manage it.Exercise is a great option. SEEK MEDICAL CARE IF:  You have pain that is not relieved with rest or medicine.  You have pain that does not improve in 1 week.  You have new symptoms.  You are generally not feeling well. SEEK   IMMEDIATE MEDICAL CARE IF:   You have pain that radiates from your back into your legs.  You develop new bowel or bladder control problems.  You have unusual weakness or numbness in your arms or legs.  You develop nausea or vomiting.  You develop abdominal pain.  You feel faint. Document Released: 03/22/2005 Document Revised: 09/21/2011 Document Reviewed: 07/24/2013 Exit[employer] Patient Information 2015 [employer]. This information is not intended to replace advice given to you by your health care provider. Make sure you  discuss any questions you have with your health care provider.  

## 2014-12-11 ENCOUNTER — Emergency Department (HOSPITAL_COMMUNITY)
Admission: EM | Admit: 2014-12-11 | Discharge: 2014-12-11 | Disposition: A | Discharge status: Home / Self Care | Payer: Medicare Other | Attending: Emergency Medicine | Admitting: Emergency Medicine

## 2014-12-11 ENCOUNTER — Encounter (HOSPITAL_COMMUNITY): Payer: Self-pay | Admitting: *Deleted

## 2014-12-11 DIAGNOSIS — M545 Low back pain, unspecified: Secondary | ICD-10-CM

## 2014-12-11 DIAGNOSIS — Z79899 Other long term (current) drug therapy: Secondary | ICD-10-CM | POA: Diagnosis not present

## 2014-12-11 DIAGNOSIS — J45909 Unspecified asthma, uncomplicated: Secondary | ICD-10-CM | POA: Insufficient documentation

## 2014-12-11 DIAGNOSIS — Z880 Allergy status to penicillin: Secondary | ICD-10-CM | POA: Insufficient documentation

## 2014-12-11 DIAGNOSIS — G40909 Epilepsy, unspecified, not intractable, without status epilepticus: Secondary | ICD-10-CM | POA: Diagnosis not present

## 2014-12-11 DIAGNOSIS — M549 Dorsalgia, unspecified: Secondary | ICD-10-CM | POA: Diagnosis present

## 2014-12-11 DIAGNOSIS — Z8709 Personal history of other diseases of the respiratory system: Secondary | ICD-10-CM | POA: Insufficient documentation

## 2014-12-11 MED ORDER — NAPROXEN 250 MG PO TABS
500.0000 mg | ORAL_TABLET | Freq: Once | ORAL | Status: AC
  Administered 2014-12-11: 500 mg via ORAL
  Filled 2014-12-11: qty 2

## 2014-12-11 NOTE — ED Provider Notes (Signed)
CSN: [id number]     Arrival date & time 12/11/14  1413 History   First MD Initiated Contact with Patient 12/11/14 1558     Chief Complaint  Patient presents with  . Back Pain     (Consider location/radiation/quality/duration/timing/severity/associated sxs/prior Treatment) HPI   Jeremy [name] is a 34 y.o. male who presents for evaluation of mid back pain which started today while riding in a car. He had similar pain 2 weeks ago when he is evaluated here, and treated with Naprosyn. At that time. The pain improved with this treatment. No recent trauma. He spends a lot of time in the car, and feels like that aggravate his pain. He denies nausea, vomiting, weakness or dizziness. He did not try anything for the pain today. There are no other known modifying factors.   Past Medical History  Diagnosis Date  . Bronchiectasis     Chronic, left lower lobe  . Asthma   . Back pain   . Seizures    Past Surgical History  Procedure Laterality Date  . Left lower lobectomy  09/07/2010    Dr Arlyce Dice  . Left thoracotomy, exploration for hemorrhage  09/08/10    Dr Arlyce Dice   No family history on file. Social History  Substance Use Topics  . Smoking status: Never Smoker   . Smokeless tobacco: Never Used  . Alcohol Use: No    Review of Systems  All other systems reviewed and are negative.     Allergies  Penicillins  Home Medications   Prior to Admission medications   Medication Sig Start Date End Date Taking? Authorizing Provider  fish oil-omega-3 fatty acids 1000 MG capsule Take 1 g by mouth daily.     Yes Historical Provider, MD  lamoTRIgine (LAMICTAL) 150 MG tablet Take 150 mg by mouth daily.    Yes Historical Provider, MD  topiramate (TOPAMAX) 200 MG tablet Take 200 mg by mouth daily.     Yes Historical Provider, MD  albuterol (PROAIR HFA) 108 (90 BASE) MCG/ACT inhaler Inhale 1 puff into the lungs every 6 (six) hours as needed for wheezing or shortness of breath.    Historical Provider,  MD  cyclobenzaprine (FLEXERIL) 10 MG tablet Take 10 mg by mouth 3 (three) times daily.  07/02/14   Historical Provider, MD   BP 98/64 mmHg  Pulse 120  Temp(Src) 98.8 F (37.1 C) (Oral)  Resp 18  Ht '6\' 3"'$  (1.905 m)  Wt 167 lb (75.751 kg)  BMI 20.87 kg/m2  SpO2 96% Physical Exam  Constitutional: He is oriented to person, place, and time. He appears well-developed and well-nourished. No distress.  HENT:  Head: Normocephalic and atraumatic.  Right Ear: External ear normal.  Left Ear: External ear normal.  Eyes: Conjunctivae and EOM are normal. Pupils are equal, round, and reactive to light.  Neck: Normal range of motion and phonation normal. Neck supple.  Cardiovascular: Normal rate, regular rhythm and normal heart sounds.   Pulmonary/Chest: Effort normal and breath sounds normal. He exhibits no bony tenderness.  Abdominal: Soft. There is no tenderness.  Musculoskeletal: Normal range of motion.  Mild upper lumbar tenderness to palpation, with normal range of motion of the back  Neurological: He is alert and oriented to person, place, and time. No cranial nerve deficit or sensory deficit. He exhibits normal muscle tone. Coordination normal.  He walks with a normal gait.  Skin: Skin is warm, dry and intact.  Psychiatric: He has a normal mood and affect. His  behavior is normal. Judgment and thought content normal.  Nursing note and vitals reviewed.   ED Course  Procedures (including critical care time) Medications  naproxen (NAPROSYN) tablet 500 mg (not administered)    Patient Vitals for the past 24 hrs:  BP Temp Temp src Pulse Resp SpO2 Height Weight  12/11/14 1419 98/64 mmHg 98.8 F (37.1 C) Oral 120 18 96 % '6\' 3"'$  (1.905 m) 167 lb (75.751 kg)       Labs Review Labs Reviewed - No data to display  Imaging Review No results found. I have personally reviewed and evaluated these images and lab results as part of my medical decision-making.   EKG Interpretation None       MDM   Final diagnoses:  Midline low back pain without sciatica    Nonspecific and very recent onset of mild back pain. No red flags or significant pain.  Nursing Notes Reviewed/ Care Coordinated Applicable Imaging Reviewed Interpretation of Laboratory Data incorporated into ED treatment  The patient appears reasonably screened and/or stabilized for discharge and I doubt any other medical condition or other [facility] requiring further screening, evaluation, or treatment in the ED at this time prior to discharge.  Plan: Home Medications- IBU prn; Home Treatments- rest; return here if the recommended treatment, does not improve the symptoms; Recommended follow up- PCP prn     Daleen Bo, MD 12/22/14 [phone]

## 2014-12-11 NOTE — Discharge Instructions (Signed)
Take Naprosyn twice a day as needed for pain. This medicine is available over-the-counter and does not require a prescription. You can use heat on the sore area 2 or 3 times a day, to help the pain.   Back Pain, Adult Low back pain is very common. About 1 in 5 people have back pain.The cause of low back pain is rarely dangerous. The pain often gets better over time.About half of people with a sudden onset of back pain feel better in just 2 weeks. About 8 in 10 people feel better by 6 weeks.  CAUSES Some common causes of back pain include:  Strain of the muscles or ligaments supporting the spine.  Wear and tear (degeneration) of the spinal discs.  Arthritis.  Direct injury to the back. DIAGNOSIS Most of the time, the direct cause of low back pain is not known.However, back pain can be treated effectively even when the exact cause of the pain is unknown.Answering your caregiver's questions about your overall health and symptoms is one of the most accurate ways to make sure the cause of your pain is not dangerous. If your caregiver needs more information, he or she may order lab work or imaging tests (X-rays or MRIs).However, even if imaging tests show changes in your back, this usually does not require surgery. HOME CARE INSTRUCTIONS For many people, back pain returns.Since low back pain is rarely dangerous, it is often a condition that people can learn to [facility] their own.   Remain active. It is stressful on the back to sit or stand in one place. Do not sit, drive, or stand in one place for more than 30 minutes at a time. Take short walks on level surfaces as soon as pain allows.Try to increase the length of time you walk each day.  Do not stay in bed.Resting more than 1 or 2 days can delay your recovery.  Do not avoid exercise or work.Your body is made to move.It is not dangerous to be active, even though your back may hurt.Your back will likely heal faster if you return to  being active before your pain is gone.  Pay attention to your body when you bend and lift. Many people have less discomfortwhen lifting if they bend their knees, keep the load close to their bodies,and avoid twisting. Often, the most comfortable positions are those that put less stress on your recovering back.  Find a comfortable position to sleep. Use a firm mattress and lie on your side with your knees slightly bent. If you lie on your back, put a pillow under your knees.  Only take over-the-counter or prescription medicines as directed by your caregiver. Over-the-counter medicines to reduce pain and inflammation are often the most helpful.Your caregiver may prescribe muscle relaxant drugs.These medicines help dull your pain so you can more quickly return to your normal activities and healthy exercise.  Put ice on the injured area.  Put ice in a plastic bag.  Place a towel between your skin and the bag.  Leave the ice on for 15-20 minutes, 03-04 times a day for the first 2 to 3 days. After that, ice and heat may be alternated to reduce pain and spasms.  Ask your caregiver about trying back exercises and gentle massage. This may be of some benefit.  Avoid feeling anxious or stressed.Stress increases muscle tension and can worsen back pain.It is important to recognize when you are anxious or stressed and learn ways to manage it.Exercise is a great option.  SEEK MEDICAL CARE IF:  You have pain that is not relieved with rest or medicine.  You have pain that does not improve in 1 week.  You have new symptoms.  You are generally not feeling well. SEEK IMMEDIATE MEDICAL CARE IF:   You have pain that radiates from your back into your legs.  You develop new bowel or bladder control problems.  You have unusual weakness or numbness in your arms or legs.  You develop nausea or vomiting.  You develop abdominal pain.  You feel faint. Document Released: 03/22/2005 Document Revised:  09/21/2011 Document Reviewed: 07/24/2013 [facility] Patient Information 2015 [place], [place]. This information is not intended to replace advice given to you by your health care provider. Make sure you discuss any questions you have with your health care provider.

## 2014-12-11 NOTE — ED Notes (Addendum)
Lower back pain occurred shortly PTA. Hx of back pain. States pain is same as past episodes of back pain. HR 120 in triage. Pt has been outside and states he last had po fluids early this morning.

## 2014-12-11 NOTE — ED Notes (Signed)
Pt made aware to return if symptoms worsen or if any life threatening symptoms occur.   

## 2016-05-15 ENCOUNTER — Encounter (HOSPITAL_COMMUNITY): Payer: Self-pay | Admitting: Emergency Medicine

## 2016-05-15 DIAGNOSIS — Z79899 Other long term (current) drug therapy: Secondary | ICD-10-CM | POA: Insufficient documentation

## 2016-05-15 DIAGNOSIS — J45909 Unspecified asthma, uncomplicated: Secondary | ICD-10-CM | POA: Insufficient documentation

## 2016-05-15 DIAGNOSIS — R55 Syncope and collapse: Secondary | ICD-10-CM | POA: Insufficient documentation

## 2016-05-15 NOTE — ED Triage Notes (Signed)
Per mother pt has been laying around for most of the day and not eating or drinking as normal.  Got up to get something to eat and had a syncopal episode

## 2016-05-16 ENCOUNTER — Emergency Department (HOSPITAL_COMMUNITY)
Admission: EM | Admit: 2016-05-16 | Discharge: 2016-05-16 | Disposition: A | Discharge status: Home / Self Care | Payer: Medicare Other | Attending: Emergency Medicine | Admitting: Emergency Medicine

## 2016-05-16 DIAGNOSIS — R55 Syncope and collapse: Secondary | ICD-10-CM

## 2016-05-16 LAB — I-STAT CHEM 8, ED
BUN: 11 mg/dL (ref 6–20)
CALCIUM ION: 1.17 mmol/L (ref 1.15–1.40)
CHLORIDE: 100 mmol/L — AB (ref 101–111)
Creatinine, Ser: 1.3 mg/dL — ABNORMAL HIGH (ref 0.61–1.24)
Glucose, Bld: 111 mg/dL — ABNORMAL HIGH (ref 65–99)
HCT: 48 % (ref 39.0–52.0)
Hemoglobin: 16.3 g/dL (ref 13.0–17.0)
Potassium: 4.3 mmol/L (ref 3.5–5.1)
Sodium: 138 mmol/L (ref 135–145)
TCO2: 28 mmol/L (ref 0–100)

## 2016-05-16 LAB — I-STAT TROPONIN, ED: TROPONIN I, POC: 0 ng/mL (ref 0.00–0.08)

## 2016-05-16 NOTE — Discharge Instructions (Signed)
You need to get up and walk around when you are watching TV to keep the blood flowing in your legs and body. Recheck if you have further problems.

## 2016-05-16 NOTE — ED Provider Notes (Signed)
[facility] DEPT Provider Note   CSN: [id number] Arrival date & time: 05/15/16  2158  Time seen 03:00 AM   History   Chief Complaint Chief Complaint  Patient presents with  . Loss of Consciousness    HPI RUDOLPHO CLAXTON is a 36 y.o. male.  HPI  patient states he was watching TV and he was laying down all day today and didn't get up until this evening when he got up to take his gabapentin. When he stood up his legs got weak and he collapsed. Mother states he was unconscious about 10-15 seconds. He was not confused or have a postictal state. He had no incontinence. Patient denies having any pain. He states he feels fine. Mother states he's never passed out before.  PCP Robert Bellow, MD   Past Medical History:  Diagnosis Date  . Asthma   . Back pain   . Bronchiectasis    Chronic, left lower lobe  . Seizures [facility])     Patient Active Problem List   Diagnosis Date Noted  . Seizures ([place])   . Bronchiectasis   . 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 ALLERGIC RHINITIS 04/12/2008  . SEIZURE DISORDER 04/12/2008    Past Surgical History:  Procedure Laterality Date  . left lower lobectomy  09/07/2010   Dr Arlyce Dice  . Left thoracotomy, exploration for hemorrhage  09/08/10   Dr Arlyce Dice       Home Medications    Prior to Admission medications   Medication Sig Start Date End Date Taking? Authorizing Provider  albuterol (PROAIR HFA) 108 (90 BASE) MCG/ACT inhaler Inhale 1 puff into the lungs every 6 (six) hours as needed for wheezing or shortness of breath.    Historical Provider, MD  cyclobenzaprine (FLEXERIL) 10 MG tablet Take 10 mg by mouth 3 (three) times daily.  07/02/14   Historical Provider, MD  fish oil-omega-3 fatty acids 1000 MG capsule Take 1 g by mouth daily.      Historical Provider, MD  lamoTRIgine (LAMICTAL) 150 MG tablet Take 150 mg by mouth daily.     Historical Provider, MD  topiramate (TOPAMAX) 200 MG tablet Take 200 mg by mouth daily.      Historical Provider, MD      Family History History reviewed. No pertinent family history.  Social History Social History  Substance Use Topics  . Smoking status: Never Smoker  . Smokeless tobacco: Never Used  . Alcohol use No  lives at home Lives with mother   Allergies   Penicillins   Review of Systems Review of Systems  All other systems reviewed and are negative.    Physical Exam Updated Vital Signs BP 110/70 (BP Location: Left Arm)   Pulse 77   Temp 99.3 F (37.4 C) (Oral)   Resp 20   Ht '6\' 5"'$  (1.956 m)   Wt 167 lb (75.8 kg)   SpO2 100%   BMI 19.80 kg/m   Vital signs normal    Physical Exam  Constitutional: He is oriented to person, place, and time. He appears well-developed and well-nourished.  Non-toxic appearance. He does not appear ill. No distress.  HENT:  Head: Normocephalic and atraumatic.  Right Ear: External ear normal.  Left Ear: External ear normal.  Nose: Nose normal. No mucosal edema or rhinorrhea.  Mouth/Throat: Oropharynx is clear and moist and mucous membranes are normal. No dental abscesses or uvula swelling.  nontender head to palpation, he states he didn't hit his head.  Eyes: Conjunctivae and EOM  are normal. Pupils are equal, round, and reactive to light.  Neck: Normal range of motion and full passive range of motion without pain. Neck supple.  nontender  Cardiovascular: Normal rate, regular rhythm and normal heart sounds.  Exam reveals no gallop and no friction rub.   No murmur heard. Pulmonary/Chest: Effort normal and breath sounds normal. No respiratory distress. He has no wheezes. He has no rhonchi. He has no rales. He exhibits no tenderness and no crepitus.  Abdominal: Soft. Normal appearance and bowel sounds are normal. He exhibits no distension. There is no tenderness. There is no rebound and no guarding.  Musculoskeletal: Normal range of motion. He exhibits no edema or tenderness.  Moves all extremities well.   Neurological: He is alert and oriented  to person, place, and time. He has normal strength. No cranial nerve deficit.  Skin: Skin is warm, dry and intact. No rash noted. No erythema. No pallor.  Psychiatric: He has a normal mood and affect. His speech is normal and behavior is normal. His mood appears not anxious.  Nursing note and vitals reviewed.    ED Treatments / Results  Labs (all labs ordered are listed, but only abnormal results are displayed) Results for orders placed or performed during the hospital encounter of 05/16/16  I-stat Chem 8, ED  Result Value Ref Range   Sodium 138 135 - 145 mmol/L   Potassium 4.3 3.5 - 5.1 mmol/L   Chloride 100 (L) 101 - 111 mmol/L   BUN 11 6 - 20 mg/dL   Creatinine, Ser 1.30 (H) 0.61 - 1.24 mg/dL   Glucose, Bld 111 (H) 65 - 99 mg/dL   Calcium, Ion 1.17 1.15 - 1.40 mmol/L   TCO2 28 0 - 100 mmol/L   Hemoglobin 16.3 13.0 - 17.0 g/dL   HCT 48.0 39.0 - 52.0 %  I-stat troponin, ED  Result Value Ref Range   Troponin i, poc 0.00 0.00 - 0.08 ng/mL   Comment 3           Laboratory interpretation all normal except stable renal insufficiency    EKG  EKG Interpretation  Date/Time:  Sunday May 16 2016 03:28:50 EST Ventricular Rate:  75 PR Interval:    QRS Duration: 104 QT Interval:  365 QTC Calculation: 408 R Axis:   67 Text Interpretation:  Sinus rhythm Borderline prolonged PR interval Early repolarization pattern Since last tracing rate faster 03 Sep 2010 Confirmed by Shantae Vantol  MD-I, Retha Bither ([id number]) on 05/16/2016 3:54:23 AM       Radiology No results found.  Procedures Procedures (including critical care time)  Medications Ordered in ED Medications - No data to display   Initial Impression / Assessment and Plan / ED Course  I have reviewed the triage vital signs and the nursing notes.  Pertinent labs & imaging results that were available during my care of the patient were reviewed by me and considered in my medical decision making (see chart for details).  Orthostatic VS  for the past 24 hrs:  BP- Lying Pulse- Lying BP- Sitting Pulse- Sitting BP- Standing at 0 minutes Pulse- Standing at 0 minutes  05/16/16 0348 110/70 73 115/75 76 106/68 88     Patient states he feels fine, orthostatic vital signs were normal. His EKG is without acute changes. His blood work has stable mild renal insufficiency otherwise normal. Patient was discharged home. We discussed she shouldn't lay down all day [place]. He should get up periodically to stretch his legs.  Final  Clinical Impressions(s) / ED Diagnoses   Final diagnoses:  Syncope and collapse    Plan discharge  Rolland Porter, MD, Barbette Or, MD 05/16/16 0400

## 2016-06-24 ENCOUNTER — Other Ambulatory Visit (HOSPITAL_COMMUNITY): Payer: Self-pay | Admitting: Family Medicine

## 2016-06-24 ENCOUNTER — Ambulatory Visit (HOSPITAL_COMMUNITY)
Admission: RE | Admit: 2016-06-24 | Discharge: 2016-06-24 | Disposition: A | Discharge status: Home / Self Care | Payer: Medicare Other | Source: Ambulatory Visit | Attending: Family Medicine | Admitting: Family Medicine

## 2016-06-24 DIAGNOSIS — G8929 Other chronic pain: Secondary | ICD-10-CM | POA: Insufficient documentation

## 2016-06-24 DIAGNOSIS — M5442 Lumbago with sciatica, left side: Secondary | ICD-10-CM

## 2016-11-16 ENCOUNTER — Encounter (HOSPITAL_COMMUNITY): Payer: Self-pay | Admitting: *Deleted

## 2016-11-16 ENCOUNTER — Emergency Department (HOSPITAL_COMMUNITY)
Admission: EM | Admit: 2016-11-16 | Discharge: 2016-11-16 | Disposition: A | Discharge status: Home / Self Care | Payer: Medicare Other | Attending: Emergency Medicine | Admitting: Emergency Medicine

## 2016-11-16 DIAGNOSIS — Z79899 Other long term (current) drug therapy: Secondary | ICD-10-CM | POA: Insufficient documentation

## 2016-11-16 DIAGNOSIS — M62830 Muscle spasm of back: Secondary | ICD-10-CM | POA: Diagnosis not present

## 2016-11-16 DIAGNOSIS — J45909 Unspecified asthma, uncomplicated: Secondary | ICD-10-CM | POA: Diagnosis not present

## 2016-11-16 DIAGNOSIS — M549 Dorsalgia, unspecified: Secondary | ICD-10-CM | POA: Diagnosis present

## 2016-11-16 MED ORDER — CYCLOBENZAPRINE HCL 10 MG PO TABS
10.0000 mg | ORAL_TABLET | Freq: Once | ORAL | Status: AC
  Administered 2016-11-16: 10 mg via ORAL
  Filled 2016-11-16: qty 1

## 2016-11-16 MED ORDER — ACETAMINOPHEN 500 MG PO TABS
1000.0000 mg | ORAL_TABLET | Freq: Once | ORAL | Status: AC
  Administered 2016-11-16: 1000 mg via ORAL
  Filled 2016-11-16: qty 2

## 2016-11-16 MED ORDER — CYCLOBENZAPRINE HCL 10 MG PO TABS: 10.0000 mg | ORAL_TABLET | Freq: Three times a day (TID) | ORAL | 0 refills | Status: DC | PRN

## 2016-11-16 NOTE — ED Notes (Signed)
Pt ambulatory to waiting room. Pt verbalized understanding of discharge instructions.   

## 2016-11-16 NOTE — ED Triage Notes (Signed)
Pt c/o mid back spasms that go down into the legs that have been worsening recently.

## 2016-11-16 NOTE — Discharge Instructions (Signed)
Please apply heating pad to the mid and lower back when sitting or resting. Please continue the steroid medication that was started on yesterday by your doctor. Please add  flexeril 3 times daily. Flexeril may cause drowsiness, please use this medication with caution. Please see Dr. Karie Kirks for follow-up concerning the back pain and spasm.

## 2016-11-16 NOTE — ED Provider Notes (Signed)
[facility] DEPT Provider Note   CSN: [id number] Arrival date & time: 11/16/16  1741     History   Chief Complaint Chief Complaint  Patient presents with  . Back Pain    HPI Jeremy [name] is a 36 y.o. male.  Patient is a 36 year old male who presents to the emergency department with a complaint of mid and lower back pain.  Patient has a history of cerebral palsy and seizure disorder. He has a history of back related spasm. The patient has been having increasing spasm recently. He was seen by his primary physician on yesterday, and started on steroid dosing. The mother and the patient states that the spasm seems to be getting worse. It affects his getting around and they came to the emergency department today for additional evaluation and assistance with this problem. No recent problems with loss of bowel or bladder function. No recent falls, injury, or trauma to the lower back.      Past Medical History:  Diagnosis Date  . Asthma   . Back pain   . Bronchiectasis    Chronic, left lower lobe  . Seizures [facility])     Patient Active Problem List   Diagnosis Date Noted  . Seizures ([place])   . Bronchiectasis   . 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 ALLERGIC RHINITIS 04/12/2008  . SEIZURE DISORDER 04/12/2008    Past Surgical History:  Procedure Laterality Date  . left lower lobectomy  09/07/2010   Dr Arlyce Dice  . Left thoracotomy, exploration for hemorrhage  09/08/10   Dr Arlyce Dice       Home Medications    Prior to Admission medications   Medication Sig Start Date End Date Taking? Authorizing Provider  albuterol (PROAIR HFA) 108 (90 BASE) MCG/ACT inhaler Inhale 1 puff into the lungs every 6 (six) hours as needed for wheezing or shortness of breath.    [provider]  cyclobenzaprine (FLEXERIL) 10 MG tablet Take 10 mg by mouth 3 (three) times daily.  07/02/14   [provider]  fish oil-omega-3 fatty acids 1000 MG capsule Take 1 g by mouth daily.      [provider]  lamoTRIgine (LAMICTAL) 150 MG tablet Take 150 mg by mouth daily.     [provider]  topiramate (TOPAMAX) 200 MG tablet Take 200 mg by mouth daily.      [provider]    Family History No family history on file.  Social History Social History  Substance Use Topics  . Smoking status: Never Smoker  . Smokeless tobacco: Never Used  . Alcohol use No     Allergies   Penicillins   Review of Systems Review of Systems  Constitutional: Negative for activity change.       All ROS Neg except as noted in HPI  HENT: Negative for nosebleeds.   Eyes: Negative for photophobia and discharge.  Respiratory: Negative for cough, shortness of breath and wheezing.   Cardiovascular: Negative for chest pain and palpitations.  Gastrointestinal: Negative for abdominal pain and blood in stool.  Genitourinary: Negative for dysuria, frequency and hematuria.  Musculoskeletal: Positive for back pain. Negative for arthralgias and neck pain.  Skin: Negative.   Neurological: Negative for dizziness, seizures and speech difficulty.  Psychiatric/Behavioral: Negative for confusion and hallucinations.     Physical Exam Updated Vital Signs BP 113/78 (BP Location: Right Arm)   Pulse 95   Temp 97.7 F (36.5 C) (Oral)   Resp 17   Ht 6'  5" (1.956 m)   Wt 77.1 kg (170 lb)   SpO2 97%   BMI 20.16 kg/m   Physical Exam  Constitutional: He is oriented to person, place, and time. He appears well-developed and well-nourished.  Non-toxic appearance.  HENT:  Head: Normocephalic.  Right Ear: Tympanic membrane and external ear normal.  Left Ear: Tympanic membrane and external ear normal.  Eyes: Pupils are equal, round, and reactive to light. EOM and lids are normal.  Neck: Normal range of motion. Neck supple. Carotid bruit is not present.  Cardiovascular: Normal rate, regular rhythm, normal heart sounds, intact distal pulses and normal pulses.   Pulmonary/Chest: Breath sounds  normal. No respiratory distress.  Abdominal: Soft. Bowel sounds are normal. There is no tenderness. There is no guarding.  Musculoskeletal: Normal range of motion. He exhibits tenderness.  There is no palpable step off of the cervical, thoracic, or lumbar spine. There is multiple areas of spasm on the right and left lumbar paraspinal area and the lower thoracic paraspinal area. No hot areas appreciated.  Mild soreness of the left knee. No effusion.  Lymphadenopathy:       Head (right side): No submandibular adenopathy present.       Head (left side): No submandibular adenopathy present.    He has no cervical adenopathy.  Neurological: He is alert and oriented to person, place, and time. He has normal strength. No cranial nerve deficit or sensory deficit.  Skin: Skin is warm and dry.  Psychiatric: He has a normal mood and affect. His speech is normal.  Nursing note and vitals reviewed.    ED Treatments / Results  Labs (all labs ordered are listed, but only abnormal results are displayed) Labs Reviewed - No data to display  EKG  EKG Interpretation None       Radiology No results found.  Procedures Procedures (including critical care time)  Medications Ordered in ED Medications - No data to display   Initial Impression / Assessment and Plan / ED Course  I have reviewed the triage vital signs and the nursing notes.  Pertinent labs & imaging results that were available during my care of the patient were reviewed by me and considered in my medical decision making (see chart for details).       Final Clinical Impressions(s) / ED Diagnoses MDM] Vital signs within normal limits. There no acute neuro deficits appreciated. Suspect the pain is related to spasm in the mid and lower back. Patient will be treated with Zanaflex 3 times daily. He is asked to continue the steroid medication prescribed by his physician. I've also asked him to use a heating pad to the mid and lower back.  The patient and the mother are in agreement with this plan.    Final diagnoses:  Muscle spasm of back    New Prescriptions New Prescriptions   CYCLOBENZAPRINE (FLEXERIL) 10 MG TABLET    Take 1 tablet (10 mg total) by mouth 3 (three) times daily as needed for muscle spasms.     Lily Kocher, PA-C 11/16/16 Kathyrn Drown    Nat Christen, MD 11/17/16 [phone]

## 2016-11-29 ENCOUNTER — Other Ambulatory Visit (HOSPITAL_COMMUNITY): Payer: Self-pay | Admitting: Family Medicine

## 2016-11-29 ENCOUNTER — Ambulatory Visit (HOSPITAL_COMMUNITY)
Admission: RE | Admit: 2016-11-29 | Discharge: 2016-11-29 | Disposition: A | Discharge status: Home / Self Care | Payer: Medicare Other | Source: Ambulatory Visit | Attending: Family Medicine | Admitting: Family Medicine

## 2016-11-29 DIAGNOSIS — R52 Pain, unspecified: Secondary | ICD-10-CM

## 2016-11-29 DIAGNOSIS — M546 Pain in thoracic spine: Secondary | ICD-10-CM | POA: Diagnosis present

## 2016-12-22 ENCOUNTER — Other Ambulatory Visit (HOSPITAL_COMMUNITY): Payer: Self-pay | Admitting: Family Medicine

## 2016-12-22 DIAGNOSIS — M62830 Muscle spasm of back: Secondary | ICD-10-CM

## 2016-12-28 ENCOUNTER — Ambulatory Visit (HOSPITAL_COMMUNITY)
Admission: RE | Admit: 2016-12-28 | Discharge: 2016-12-28 | Disposition: A | Discharge status: Home / Self Care | Payer: Medicare Other | Source: Ambulatory Visit | Attending: Family Medicine | Admitting: Family Medicine

## 2016-12-28 DIAGNOSIS — M5124 Other intervertebral disc displacement, thoracic region: Secondary | ICD-10-CM | POA: Diagnosis not present

## 2016-12-28 DIAGNOSIS — M4804 Spinal stenosis, thoracic region: Secondary | ICD-10-CM | POA: Insufficient documentation

## 2016-12-28 DIAGNOSIS — M62830 Muscle spasm of back: Secondary | ICD-10-CM | POA: Insufficient documentation

## 2017-04-23 ENCOUNTER — Emergency Department (HOSPITAL_COMMUNITY): Payer: Medicare Other

## 2017-04-23 ENCOUNTER — Encounter (HOSPITAL_COMMUNITY): Payer: Self-pay | Admitting: *Deleted

## 2017-04-23 ENCOUNTER — Emergency Department (HOSPITAL_COMMUNITY)
Admission: EM | Admit: 2017-04-23 | Discharge: 2017-04-23 | Disposition: A | Discharge status: Home / Self Care | Payer: Medicare Other | Attending: Emergency Medicine | Admitting: Emergency Medicine

## 2017-04-23 DIAGNOSIS — Z79899 Other long term (current) drug therapy: Secondary | ICD-10-CM | POA: Insufficient documentation

## 2017-04-23 DIAGNOSIS — B9789 Other viral agents as the cause of diseases classified elsewhere: Secondary | ICD-10-CM | POA: Diagnosis not present

## 2017-04-23 DIAGNOSIS — R05 Cough: Secondary | ICD-10-CM | POA: Diagnosis present

## 2017-04-23 DIAGNOSIS — J45909 Unspecified asthma, uncomplicated: Secondary | ICD-10-CM | POA: Insufficient documentation

## 2017-04-23 DIAGNOSIS — J069 Acute upper respiratory infection, unspecified: Secondary | ICD-10-CM | POA: Insufficient documentation

## 2017-04-23 MED ORDER — PREDNISONE 20 MG PO TABS: 40.0000 mg | ORAL_TABLET | Freq: Every day | ORAL | 0 refills | Status: AC

## 2017-04-23 MED ORDER — ACETAMINOPHEN 325 MG PO TABS
650.0000 mg | ORAL_TABLET | Freq: Once | ORAL | Status: AC
  Administered 2017-04-23: 650 mg via ORAL
  Filled 2017-04-23: qty 2

## 2017-04-23 MED ORDER — ALBUTEROL SULFATE HFA 108 (90 BASE) MCG/ACT IN AERS
2.0000 | INHALATION_SPRAY | Freq: Once | RESPIRATORY_TRACT | Status: AC
  Administered 2017-04-23: 2 via RESPIRATORY_TRACT
  Filled 2017-04-23: qty 6.7

## 2017-04-23 MED ORDER — BENZONATATE 100 MG PO CAPS: 200.0000 mg | ORAL_CAPSULE | Freq: Three times a day (TID) | ORAL | 0 refills | Status: AC | PRN

## 2017-04-23 NOTE — ED Triage Notes (Signed)
Pt mother reports he has had a cough for several days and today started having painful  spasms in his back. Ambulatory to triage with steady gait.

## 2017-04-23 NOTE — ED Notes (Signed)
EDP at bedside  

## 2017-04-23 NOTE — ED Notes (Signed)
Pt returned from xray

## 2017-04-23 NOTE — ED Notes (Signed)
ED Provider at bedside. 

## 2017-04-23 NOTE — ED Provider Notes (Signed)
[facility] EMERGENCY DEPARTMENT Provider Note   CSN: [id number] Arrival date & time: 04/23/17  2048     History   Chief Complaint Chief Complaint  Patient presents with  . Cough    HPI Jeremy [name] is a 37 y.o. male.  HPI   Jeremy [name] is a 37 y.o. male with a history of chronic low back pain, who presents to the Emergency Department complaining of cough for several days.  Cough is been productive at times and worse with exertion.  He reports one episode of posttussive emesis that occurred earlier today.  He also reports some sneezing, nasal congestion and runny nose and fever.  Is been taking over-the-counter allergy medications without relief.  He also complains of pain to the mid lower back and believes the coughing has excerbated his low back pain.  Pain is similar to his previous low back pain.  He has been taking naproxen and hydrocodone prescribed by his PCP which controls the back pain.  He denies any new injury or worsening symptoms associated with his back.  Denies abdominal pain, chest pain, shortness of breath, numbness or weakness of the lower extremities,  urine or bowel incontinence or retention.     Past Medical History:  Diagnosis Date  . Asthma   . Back pain   . Bronchiectasis    Chronic, left lower lobe  . Seizures [facility])     Patient Active Problem List   Diagnosis Date Noted  . Seizures ([place])   . Bronchiectasis   . 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 ALLERGIC RHINITIS 04/12/2008  . SEIZURE DISORDER 04/12/2008    Past Surgical History:  Procedure Laterality Date  . left lower lobectomy  09/07/2010   Dr Arlyce Dice  . Left thoracotomy, exploration for hemorrhage  09/08/10   Dr Arlyce Dice       Home Medications    Prior to Admission medications   Medication Sig Start Date End Date Taking? Authorizing Provider  albuterol (PROAIR HFA) 108 (90 BASE) MCG/ACT inhaler Inhale 1 puff into the lungs every 6 (six) hours as needed for wheezing or shortness of breath.     [provider]  cyclobenzaprine (FLEXERIL) 10 MG tablet Take 1 tablet (10 mg total) by mouth 3 (three) times daily as needed for muscle spasms. 11/16/16   Lily Kocher, PA-C  fish oil-omega-3 fatty acids 1000 MG capsule Take 1 g by mouth daily.      [provider]  lamoTRIgine (LAMICTAL) 150 MG tablet Take 150 mg by mouth daily.     [provider]  topiramate (TOPAMAX) 200 MG tablet Take 200 mg by mouth daily.      [provider]    Family History No family history on file.  Social History Social History   Tobacco Use  . Smoking status: Never Smoker  . Smokeless tobacco: Never Used  Substance Use Topics  . Alcohol use: No  . Drug use: No     Allergies   Penicillins   Review of Systems Review of Systems  Constitutional: Positive for fever. Negative for appetite change and chills.  HENT: Positive for congestion, rhinorrhea and sneezing. Negative for sore throat and trouble swallowing.   Respiratory: Positive for cough. Negative for chest tightness, shortness of breath and wheezing.   Cardiovascular: Negative for chest pain.  Gastrointestinal: Positive for vomiting (post tussive emesis). Negative for abdominal pain and nausea.  Genitourinary: Negative for decreased urine volume, difficulty urinating, dysuria, flank pain and frequency.  Musculoskeletal: Positive for back pain. Negative for arthralgias.  Skin: Negative for rash.  Neurological: Negative for dizziness, weakness and numbness.  Hematological: Negative for adenopathy.  All other systems reviewed and are negative.    Physical Exam Updated Vital Signs BP 118/74 (BP Location: Right Arm)   Pulse (!) 101   Temp 99.9 F (37.7 C) (Oral)   Resp 17   Ht 6\' 1"  (1.854 m)   Wt 70 kg (154 lb 6.4 oz)   SpO2 93%   BMI 20.37 kg/m   Physical Exam  Constitutional: He is oriented to person, place, and time. He appears well-developed and well-nourished. No distress.  HENT:  Head:  Normocephalic and atraumatic.  Right Ear: Tympanic membrane and ear canal normal.  Left Ear: Tympanic membrane and ear canal normal.  Mouth/Throat: Uvula is midline, oropharynx is clear and moist and mucous membranes are normal. No oropharyngeal exudate.  Eyes: EOM are normal. Pupils are equal, round, and reactive to light.  Neck: Normal range of motion, full passive range of motion without pain and phonation normal. Neck supple.  Cardiovascular: Normal rate, regular rhythm and intact distal pulses.  No murmur heard. Pulmonary/Chest: Effort normal. No stridor. No respiratory distress. He has wheezes. He has no rales. He exhibits no tenderness.  Coarse lungs sounds bilaterally.  Few inspiratory wheezes.  No rales.  Speaking in full sentences without distress.   Abdominal: Soft. He exhibits no distension. There is no tenderness. There is no guarding.  Musculoskeletal: He exhibits no edema.  Lymphadenopathy:    He has no cervical adenopathy.  Neurological: He is alert and oriented to person, place, and time. He exhibits normal muscle tone. Coordination normal.  Skin: Skin is warm and dry. Capillary refill takes less than 2 seconds.  Nursing note and vitals reviewed.    ED Treatments / Results  Labs (all labs ordered are listed, but only abnormal results are displayed) Labs Reviewed - No data to display  EKG  EKG Interpretation None       Radiology Dg Chest 2 View  Result Date: 04/23/2017 CLINICAL DATA:  Cough. EXAM: CHEST  2 VIEW COMPARISON:  November 25, 2014 FINDINGS: Healed left rib fracture. No pneumothorax. No nodules or masses. No focal infiltrates. The cardiomediastinal silhouette is normal. IMPRESSION: No active cardiopulmonary disease. Electronically Signed   By: Dorise Bullion III M.D   On: 04/23/2017 22:50    Procedures Procedures (including critical care time)  Medications Ordered in ED Medications  albuterol (PROVENTIL HFA;VENTOLIN HFA) 108 (90 Base) MCG/ACT  inhaler 2 puff (not administered)  acetaminophen (TYLENOL) tablet 650 mg (not administered)     Initial Impression / Assessment and Plan / ED Course  I have reviewed the triage vital signs and the nursing notes.  Pertinent labs & imaging results that were available during my care of the patient were reviewed by me and considered in my medical decision making (see chart for details).     On recheck, lung sounds have improved.  Patient reports feeling better.  He is ambulatory in the department with a steady gait.  No focal neuro deficits on exam.  Back pain is likely acute on chronic and secondary to excessive coughing.  Patient appears safe for discharge home agrees to treatment plan which includes Tessalon, albuterol MDI and prednisone.  Final Clinical Impressions(s) / ED Diagnoses   Final diagnoses:  Viral URI with cough    ED Discharge Orders    None       Aviyanna Colbaugh,  Clista Bernhardt 04/23/17 2304    Noemi Chapel, MD 04/24/17 1500

## 2017-04-23 NOTE — Discharge Instructions (Signed)
1-2 puffs of the albuterol inhaler every 4-6 hours as needed for cough or wheezing.  Drink plenty of fluids. Follow-up with your primary doctor or return to the ER for any worsening symptoms.

## 2017-08-03 ENCOUNTER — Ambulatory Visit (HOSPITAL_COMMUNITY): Payer: Medicare Other | Attending: Orthopedic Surgery

## 2017-08-03 DIAGNOSIS — M545 Low back pain, unspecified: Secondary | ICD-10-CM

## 2017-08-07 NOTE — Therapy (Signed)
[place], [place], [address] Phone: [phone]   Fax:  [phone]  Physical Therapy Evaluation  Patient Details  Name: Jeremy [name] MRN: [id number] Date of Birth: 03-Dec-1980 No data recorded  Encounter Date: 08/03/2017     08/03/17 1540  PT Visits / Re-Eval  Visit Number 1  Number of Visits 1  Authorization  Authorization Type Medicare Part A and B  Authorization Time Period 08/03/17-08/03/17  Authorization - Visit Number 1  Authorization - Number of Visits 1  PT Time Calculation  PT Start Time 1518  PT Stop Time 1619  PT Time Calculation (min) 61 min  PT - End of Session  Activity Tolerance Patient tolerated treatment well  Behavior During Therapy [facility] for tasks assessed/performed (energetic, excited, and cheerful)    Past Medical History:  Diagnosis Date  . Asthma   . Back pain   . Bronchiectasis    Chronic, left lower lobe  . Seizures ([place])     Past Surgical History:  Procedure Laterality Date  . left lower lobectomy  09/07/2010   Dr Arlyce Dice  . Left thoracotomy, exploration for hemorrhage  09/08/10   Dr Arlyce Dice    There were no vitals filed for this visit.       08/03/17 1531  Symptoms/Limitations  Subjective Patient arrives reporting he is feeling good today and is cheerful. He reports he has been having some back pain occasionally in his mid to low back and describes it as "my nerve spasm". 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 He denies any back surgeries and his mother confirms this; she reports he did not haev any shunts placed or surgeries performed at birth and never recieved any physical therapy or rehab as a child for mobility related to cerebral palsy diagnosis.  Pertinent History 2012  operation to remove portion of his lung that had collapsed (pt's mother reports)  How Jeremy [name] can you sit comfortably? unlimited  How Jeremy [name] can you stand comfortably? unlimited  How Jeremy [name] can you walk comfortably? unlimited  Patient Stated Goals for spasms to stop  Pain Assessment  Currently in Pain? No/denies       08/03/17 0001  Assessment  Medical Diagnosis Low Back Pain  Referring Provider Melina Schools, MD  Onset Date/Surgical Date  (unsure)  Next MD Visit  (need to schedule)  Prior Therapy none  Precautions  Precautions None  Restrictions  Weight Bearing Restrictions No  Balance Screen  Has the patient fallen in the past 6 months No  Has the patient had a decrease in activity level because of a fear of falling?  No  Is the patient reluctant to leave their home because of a fear of falling?  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  Leisure likes to play basketball,  riding bikes, he cooks and is Independent for ADL's  Cognition  Overall Cognitive Status Within Functional Limits for tasks assessed  Observation/Other Assessments  Observations Muscle spasm: patient with incresaed tone thorughout his body during muscle spasm; incresaed tone in Bil UE's, increased tone in Bil LE's (Lt>Rt)  Functional Tests  Functional tests Squat;Single leg stance  Squat  Comments 10 squats: decreased depth, early heal rise, decreased hip flexion  Single Leg Stance  Comments Rt LE = 30 second, Lt LE =5  seconds  Posture/Postural Control  Posture/Postural Control Postural limitations  Postural Limitations Forward head;Rounded Shoulders;Decreased lumbar lordosis  Tone  Assessment Location RLE;LLE  ROM / Strength  AROM / PROM / Strength AROM;Strength  AROM  AROM Assessment Site Lumbar  Lumbar Flexion 55  Lumbar Extension 12  Lumbar - Right Side Bend 23  Lumbar - Left Side Bend 25  Lumbar - Right Rotation 10  Lumbar - Left Rotation 7  Strength  Strength Assessment Site Hip;Knee;Ankle  Right/Left Knee Right;Left  Right Hip Flexion 5/5  Right Hip Extension 5/5  Right Hip ABduction 5/5  Left Hip Flexion 4+/5  Left Hip Extension 5/5  Left Hip ABduction 4+/5  Right Knee Flexion 5/5  Right Knee Extension 5/5  Left Knee Flexion 5/5  Left Knee Extension 5/5  Right Ankle Dorsiflexion 5/5  Left Ankle Dorsiflexion 4+/5  RLE Tone  RLE Tone Modified Ashworth  RLE Tone  Modified Ashworth Scale for Grading Hypertonia RLE 1+  LLE Tone  LLE Tone Modified Ashworth  LLE Tone  Modified Ashworth Scale for Grading Hypertonia LLE 1+    Objective measurements completed on examination: See above findings.      08/03/17 0001  Posture/Postural Control  Posture/Postural Control Postural limitations  Postural Limitations Forward head;Rounded Shoulders;Decreased lumbar lordosis  Exercises  Exercises Lumbar  Lumbar Exercises: Stretches  Single Knee to Chest Stretch Right;Left;2 reps;30 seconds  Lower Trunk Rotation 5 reps;10 seconds  Lower Trunk Rotation Limitations bilaterally  Hip Flexor Stretch Right;Left;20 seconds;2 reps  Hip Flexor Stretch Limitations sidelying quad stretch  Lumbar Exercises: Prone  Other Prone Lumbar Exercises Prone press up to forearms: 10x 10 seconds        08/03/17 1540  PT Education  Education provided Yes  Education Details Educated on HEP for low back and LE stretches and encouraged patient and his mother to discuss increased tone observed during "nerve  spasm" with his doctor at his follow up (Dr. Rolena Infante). Discussed his overall function and that it is not currently limiting his mobility and may need further testing or require follow up medical managed.  Person(s) Educated Patient;Parent(s) (mother)  Methods Explanation;Handout  Comprehension Verbalized understanding      PT Short Term Goals - 08/03/17 1631      PT SHORT TERM GOAL #1   Title  Patient will be independent with HEP for low back and LE stretching with assistance from his mother PRN to reduce LE tightness and improve mobilty.    Time  1    Period  Days    Status  Achieved    Target Date  08/03/17           08/03/17 1643  Plan  Clinical Impression Statement Jeremy [name] presents to physical therapy for evaluation of low back pain. He has reported occasional muscle/nerve spasms in his mid-low back and had one episode during the evaluation. During the spasm the patient has a shortening of his right low back musculature and increased tone was noted  throughout his Bil UE, paraspinals, and his Bil LE's. The spasm and his pain resolved within ~10 seconds and he reported there was no lingering pain after. Objective testing revealed that the patient has some tone at rest in bil LE's however no tone was noted with testing UE's at rest. He also has full ROM in his lumbar spine, 5/5 muscle strength (with exception to some Lt LE muscles), and his spasm/pain was unable to be provoked with functional and objective testing. Due to patient and his mother both reporting he is not limited with functional mobility or his daily activities and the observation that his spasm caused an increase in tone throughout his entire body I explained to them that this is likely occurring due his spasticity secondary to his cerebral palsy. I encouraged them to follow up with Dr. Rolena Infante regarding medical management of his tone as he has never had any medication to address tone previously. I educated Jeremy [name] on exercise  for stretching to reduce muscle tightness for low back and LE's and provided pictures as well as instructions for his mother to assist him with the exercises PRN.    History and Personal Factors relevant to plan of care: cerebral palsy, lung lobectomy,   Clinical Presentation Stable  Clinical Decision Making Low  Rehab Potential Good  PT Frequency One time visit  PT Treatment/Interventions Therapeutic exercise  PT Next Visit Plan Discharge patient with HEP  PT Home Exercise Plan prone press up; single knee to chest, low trunk rotations, sidelying quad stretch  Consulted and Agree with Plan of Care Patient;Family member/caregiver  Family Member Consulted mother       Patient will benefit from skilled therapeutic intervention in order to improve the following deficits and impairments:     Visit Diagnosis: Acute low back pain without sciatica, unspecified back pain laterality     Problem List Patient Active Problem List   Diagnosis Date Noted  . Seizures ([place])   . Bronchiectasis   . 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 ALLERGIC RHINITIS 04/12/2008  . SEIZURE DISORDER 04/12/2008    Jeremy [name], PT, DPT Physical Therapist with [facility]  08/03/2017 7:18 PM    [place], [place], [address] Phone: [phone]   Fax:  [phone]  Name: BRAXDON GAPPA MRN: [id number] Date of Birth: Dec 12, 1980

## 2019-01-23 ENCOUNTER — Other Ambulatory Visit: Payer: Self-pay | Admitting: *Deleted

## 2019-01-23 DIAGNOSIS — Z20822 Contact with and (suspected) exposure to covid-19: Secondary | ICD-10-CM

## 2019-01-24 LAB — NOVEL CORONAVIRUS, NAA: SARS-CoV-2, NAA: NOT DETECTED

## 2021-05-29 ENCOUNTER — Other Ambulatory Visit (HOSPITAL_COMMUNITY): Payer: Self-pay | Admitting: Neurology

## 2021-05-29 ENCOUNTER — Ambulatory Visit (HOSPITAL_COMMUNITY)
Admission: RE | Admit: 2021-05-29 | Discharge: 2021-05-29 | Disposition: A | Discharge status: Home / Self Care | Payer: Medicare Other | Source: Ambulatory Visit | Attending: Neurology | Admitting: Neurology

## 2021-05-29 ENCOUNTER — Other Ambulatory Visit: Payer: Self-pay

## 2021-05-29 DIAGNOSIS — M25532 Pain in left wrist: Secondary | ICD-10-CM | POA: Diagnosis present

## 2022-04-28 ENCOUNTER — Telehealth: Payer: Self-pay | Admitting: Neurology

## 2022-04-28 ENCOUNTER — Ambulatory Visit (INDEPENDENT_AMBULATORY_CARE_PROVIDER_SITE_OTHER): Payer: Medicare Other | Admitting: Neurology

## 2022-04-28 ENCOUNTER — Encounter: Payer: Self-pay | Admitting: Neurology

## 2022-04-28 VITALS — BP 109/70 | HR 69 | Ht 76.0 in | Wt 165.0 lb

## 2022-04-28 DIAGNOSIS — E559 Vitamin D deficiency, unspecified: Secondary | ICD-10-CM | POA: Diagnosis not present

## 2022-04-28 DIAGNOSIS — Z5181 Encounter for therapeutic drug level monitoring: Secondary | ICD-10-CM

## 2022-04-28 DIAGNOSIS — Z8661 Personal history of infections of the central nervous system: Secondary | ICD-10-CM | POA: Diagnosis not present

## 2022-04-28 DIAGNOSIS — G40909 Epilepsy, unspecified, not intractable, without status epilepticus: Secondary | ICD-10-CM

## 2022-04-28 NOTE — Telephone Encounter (Signed)
medicare NPR sent to [facility] [id number]

## 2022-04-28 NOTE — Progress Notes (Signed)
[facility]  PATIENT: Jeremy [name] DOB: 1980-12-14  REQUESTING CLINICIAN: Barton Fanny, NP HISTORY FROM: Patient, sister, mother over the phone and chart review  REASON FOR VISIT: Epilepsy, TOC    HISTORICAL  CHIEF COMPLAINT:  Chief Complaint  Patient presents with   New Patient (Initial Visit)    Rm 12. Patient is with sister, Primary care referred him here after his other neurologist had closed down. Patient has been on epidural injections for years and states he just gets them when he needs them, patient states it has been a while since he has them    HISTORY OF PRESENT ILLNESS:  This is a 42 year old gentleman past medical history of seizure disorder, thoracic radiculopathy, history of neonatal meningitis and neonatal stroke who is presenting to establish care.  Patient was previously managed [facility] neurology but now needs a new neurologist since that establishment is being closed.  In terms of the seizures, mother reported the seizure started as a child, at the age of 41 and his seizure risk factors include neonatal meningitis and history of childhood stroke.  His last seizure was in 1999.  He is on Topamax 150 mg daily and the lamotrigine 150 mg daily tolerating both medications very well and no seizures.  He cannot think the last time he had an EEG or MRI.   He was here also today requesting epidural shots.  I have informed him that we do not provide those services and he will need to see pain management.  Handedness: Right hand  Onset: As a child, age 74   Seizure Type: Generalized convulsion   Current frequency: Last one in 1999  Any injuries from seizures: Denies   Seizure risk factors: Neonatal meningitis, History of stroke as a child   Previous ASMs: Topiramate, Lamotrigine   Currenty ASMs: Topiramate 150 mg daily, Lamotrigine 150 mg daily   ASMs side effects: Denies   Brain Images: None available for review   Previous EEGs: None  available for review    OTHER MEDICAL CONDITIONS: History of neonatal meningitis, no neuro stroke, seizure disorder, thoracic radiculopathy  REVIEW OF SYSTEMS: Full 14 system review of systems performed and negative with exception of: As noted in the HPI  ALLERGIES: Allergies  Allergen Reactions   Penicillins Rash    Has patient had a PCN reaction causing immediate rash, facial/tongue/throat swelling, SOB or lightheadedness with hypotension: No Has patient had a PCN reaction causing severe rash involving mucus membranes or skin necrosis: No Has patient had a PCN reaction that required hospitalization: No Has patient had a PCN reaction occurring within the last 10 years: No If all of the above answers are "NO", then may proceed with Cephalosporin use.     HOME MEDICATIONS: Outpatient Medications Prior to Visit  Medication Sig Dispense Refill   albuterol (PROAIR HFA) 108 (90 BASE) MCG/ACT inhaler Inhale 1 puff into the lungs every 6 (six) hours as needed for wheezing or shortness of breath.     gabapentin (NEURONTIN) 100 MG capsule Take 100 mg by mouth 2 (two) times daily.     HYDROcodone-acetaminophen (NORCO/VICODIN) 5-325 MG tablet Take 1 tablet by mouth at bedtime.  0   lamoTRIgine (LAMICTAL) 150 MG tablet Take 150 mg by mouth daily.      naproxen (NAPROSYN) 500 MG tablet Take 500 mg by mouth 2 (two) times daily with a meal.     predniSONE (DELTASONE) 20 MG tablet Take 2 tablets (40 mg total) by mouth  daily. 10 tablet 0   tizanidine (ZANAFLEX) 2 MG capsule Take 2 mg by mouth 2 (two) times daily as needed for muscle spasms.     topiramate (TOPAMAX) 200 MG tablet Take 200 mg by mouth daily.       benzonatate (TESSALON) 100 MG capsule Take 2 capsules (200 mg total) by mouth 3 (three) times daily as needed for cough. Swallow whole, do not chew (Patient not taking: Reported on 04/28/2022) 21 capsule 0   No facility-administered medications prior to visit.    PAST MEDICAL HISTORY: Past  Medical History:  Diagnosis Date   Asthma    Back pain    Bronchiectasis    Chronic, left lower lobe   Seizures ([place])     PAST SURGICAL HISTORY: Past Surgical History:  Procedure Laterality Date   left lower lobectomy  09/07/2010   Dr Arlyce Dice   Left thoracotomy, exploration for hemorrhage  09/08/10   Dr Arlyce Dice    FAMILY HISTORY: No family history on file.  SOCIAL HISTORY: Social History   Socioeconomic History   Marital status: Single    Spouse name: Not on file   Number of children: Not on file   Years of education: Not on file   Highest education level: Not on file  Occupational History   Not on file  Tobacco Use   Smoking status: Never   Smokeless tobacco: Never  Substance and Sexual Activity   Alcohol use: No   Drug use: No   Sexual activity: Not on file  Other Topics Concern   Not on file  Social History Narrative   Not on file   Social Determinants of Health   Financial Resource Strain: Not on file  Food Insecurity: Not on file  Transportation Needs: Not on file  Physical Activity: Not on file  Stress: Not on file  Social Connections: Not on file  Intimate Partner Violence: Not on file    PHYSICAL EXAM  GENERAL EXAM/CONSTITUTIONAL: Vitals:  Vitals:   04/28/22 1334  BP: 109/70  Pulse: 69  Weight: 165 lb (74.8 kg)  Height: 6\' 4"  (1.93 m)   Body mass index is 20.08 kg/m. Wt Readings from Last 3 Encounters:  04/28/22 165 lb (74.8 kg)  04/23/17 154 lb 6.4 oz (70 kg)  11/16/16 170 lb (77.1 kg)   Patient is in no distress; well developed, nourished and groomed; neck is supple  EYES: Visual fields full to confrontation, Extraocular movements intacts,  No results found.  MUSCULOSKELETAL: Gait, strength, tone, movements noted in Neurologic exam below  NEUROLOGIC: MENTAL STATUS:      No data to display         awake, alert, oriented to person, but not day or year  He has mild dysarthria, able to follow simple commands   CRANIAL  NERVE:  2nd, 3rd, 4th, 6th - Visual fields full to confrontation, extraocular muscles intact, no nystagmus 5th - facial sensation symmetric 7th - facial strength symmetric 8th - hearing intact 9th - palate elevates symmetrically, uvula midline 11th - shoulder shrug symmetric 12th - tongue protrusion midline  MOTOR:  normal bulk and tone, full strength in the BUE, BLE  SENSORY:  normal and symmetric to light touch  COORDINATION:  Mild dysmetria on FNF, worse on left   GAIT/STATION:  normal     DIAGNOSTIC DATA (LABS, IMAGING, TESTING) - I reviewed patient records, labs, notes, testing and imaging myself where available.  Lab Results  Component Value Date   WBC 6.7  09/12/2010   HGB 16.3 05/16/2016   HCT 48.0 05/16/2016   MCV 83.6 09/12/2010   PLT 252 09/12/2010      Component Value Date/Time   NA 138 05/16/2016 0339   K 4.3 05/16/2016 0339   CL 100 (L) 05/16/2016 0339   CO2 26 09/07/2014 1944   GLUCOSE 111 (H) 05/16/2016 0339   BUN 11 05/16/2016 0339   CREATININE 1.30 (H) 05/16/2016 0339   CALCIUM 9.5 09/07/2014 1944   PROT 5.2 (L) 09/09/2010 0414   ALBUMIN 2.7 (L) 09/09/2010 0414   AST 22 09/09/2010 0414   ALT 10 09/09/2010 0414   ALKPHOS 35 (L) 09/09/2010 0414   BILITOT 0.5 09/09/2010 0414   GFRNONAA >60 09/07/2014 1944   GFRAA >60 09/07/2014 1944   No results found for: "CHOL", "HDL", "LDLCALC", "LDLDIRECT", "TRIG" No results found for: "HGBA1C" No results found for: "VITAMINB12" No results found for: "TSH"   ASSESSMENT AND PLAN  42 y.o. year old male  with history of meningitis, seizure disorder, childhood stroke who is presenting for management of his seizure disorder. He has done well on Lamotrigine 150 mg daily and Topiramate 150 mg daily. I will check his levels and continue patient on the same doses. I will also obtain a routine EEG and MRI Brain and I will see him in 6 months or sooner if worse.    1. Nonintractable epilepsy without status  epilepticus, unspecified epilepsy type ([place])   2. History of meningitis   3. Therapeutic drug monitoring   4. Vitamin D deficiency, unspecified     Patient Instructions  Continue with Lamotrigine 150 mg daily  Continue with Topiramate 200 mg daily  Routine EEG  MRI Brain with and without contrast  Follow up in 6 months or sooner if worse     Per [facility] statutes, patients with seizures are not allowed to drive until they have been seizure-free for six months.  Other recommendations include using caution when using heavy equipment or power tools. Avoid working on ladders or at heights. Take showers instead of baths.  Do not swim alone.  Ensure the water temperature is not too high on the home water heater. Do not go swimming alone. Do not lock yourself in a room alone (i.e. bathroom). When caring for infants or small children, sit down when holding, feeding, or changing them to minimize risk of injury to the child in the event you have a seizure. Maintain good sleep hygiene. Avoid alcohol.  Also recommend adequate sleep, hydration, good diet and minimize stress.   During the Seizure  - First, ensure adequate ventilation and place patients on the floor on their left side  Loosen clothing around the neck and ensure the airway is patent. If the patient is clenching the teeth, do not force the mouth open with any object as this can cause severe damage - Remove all items from the surrounding that can be hazardous. The patient may be oblivious to what's happening and may not even know what he or she is doing. If the patient is confused and wandering, either gently guide him/her away and block access to outside areas - Reassure the individual and be comforting - Call 911. In most cases, the seizure ends before EMS arrives. However, there are cases when seizures may last over 3 to 5 minutes. Or the individual may have developed breathing difficulties or severe injuries. If a pregnant  patient or a person with diabetes develops a seizure, it is prudent  to call an ambulance. - Finally, if the patient does not regain full consciousness, then call EMS. Most patients will remain confused for about 45 to 90 minutes after a seizure, so you must use judgment in calling for help. - Avoid restraints but make sure the patient is in a bed with padded side rails - Place the individual in a lateral position with the neck slightly flexed; this will help the saliva drain from the mouth and prevent the tongue from falling backward - Remove all nearby furniture and other hazards from the area - Provide verbal assurance as the individual is regaining consciousness - Provide the patient with privacy if possible - Call for help and start treatment as ordered by the caregiver   After the Seizure (Postictal Stage)  After a seizure, most patients experience confusion, fatigue, muscle pain and/or a headache. Thus, one should permit the individual to sleep. For the next few days, reassurance is essential. Being calm and helping reorient the person is also of importance.  Most seizures are painless and end spontaneously. Seizures are not harmful to others but can lead to complications such as stress on the lungs, brain and the heart. Individuals with prior lung problems may develop labored breathing and respiratory distress.     Orders Placed This Encounter  Procedures   MR BRAIN W WO CONTRAST   Lamotrigine level   Topiramate Level   Basic Metabolic Panel   Vitamin D, 25-hydroxy   EEG adult    No orders of the defined types were placed in this encounter.   Return in about 6 months (around 10/27/2022).    Alric Ran, MD 04/28/2022, 4:55 PM  [facility] [address], [place], [place] [address] [phone]

## 2022-04-28 NOTE — Patient Instructions (Addendum)
Continue with Lamotrigine 150 mg daily  Continue with Topiramate 200 mg daily  Routine EEG  MRI Brain with and without contrast  Follow up in 6 months or sooner if worse

## 2022-04-29 ENCOUNTER — Telehealth: Payer: Self-pay

## 2022-04-29 LAB — BASIC METABOLIC PANEL
BUN/Creatinine Ratio: 9 (ref 9–20)
BUN: 10 mg/dL (ref 6–24)
CO2: 21 mmol/L (ref 20–29)
Calcium: 9.5 mg/dL (ref 8.7–10.2)
Chloride: 107 mmol/L — ABNORMAL HIGH (ref 96–106)
Creatinine, Ser: 1.06 mg/dL (ref 0.76–1.27)
Glucose: 86 mg/dL (ref 70–99)
Potassium: 4 mmol/L (ref 3.5–5.2)
Sodium: 141 mmol/L (ref 134–144)
eGFR: 89 mL/min/{1.73_m2} (ref >59)

## 2022-04-29 LAB — LAMOTRIGINE LEVEL: Lamotrigine Lvl: 8.9 ug/mL (ref 2.0–20.0)

## 2022-04-29 LAB — VITAMIN D 25 HYDROXY (VIT D DEFICIENCY, FRACTURES): Vit D, 25-Hydroxy: 9.2 ng/mL — ABNORMAL LOW (ref 30.0–100.0)

## 2022-04-29 LAB — TOPIRAMATE LEVEL: Topiramate Lvl: 10.3 ug/mL (ref 2.0–25.0)

## 2022-04-29 MED ORDER — VITAMIN D (ERGOCALCIFEROL) 1.25 MG (50000 UNIT) PO CAPS: 50000.0000 [IU] | ORAL_CAPSULE | ORAL | 0 refills | Status: DC

## 2022-04-29 NOTE — Telephone Encounter (Signed)
Pt verified by name and DOB,  normal results given per provider, pt voiced understanding all question answered. °

## 2022-04-29 NOTE — Progress Notes (Signed)
Please call (they do not check MyChart) and advise the patient that the recent labs we checked were within normal limits except for a low Vitamin D level. Please inform patient/family that I will start him on Vitamin D supplement to take weekly for the next 12 weeks.  lease remind patient to keep any upcoming appointments or tests and to call us with any interim questions, concerns, problems or updates. Thanks,   Alric Ran, MD

## 2022-04-29 NOTE — Addendum Note (Signed)
Addended byAlric Ran on: 04/29/2022 12:19 PM   Modules accepted: Orders

## 2022-04-29 NOTE — Telephone Encounter (Signed)
-----  Message from Alric Ran, MD sent at 04/29/2022 12:19 PM EST ----- Please call (they do not check MyChart) and advise the patient that the recent labs we checked were within normal limits except for a low Vitamin D level. Please inform patient/family that I will start him on Vitamin D supplement to take weekly for the next 12 weeks.  lease remind patient to keep any upcoming appointments or tests and to call us with any interim questions, concerns, problems or updates. Thanks,   Alric Ran, MD

## 2022-05-13 ENCOUNTER — Ambulatory Visit (INDEPENDENT_AMBULATORY_CARE_PROVIDER_SITE_OTHER): Payer: Medicare Other | Admitting: Neurology

## 2022-05-13 DIAGNOSIS — G40909 Epilepsy, unspecified, not intractable, without status epilepticus: Secondary | ICD-10-CM

## 2022-05-13 DIAGNOSIS — Z5181 Encounter for therapeutic drug level monitoring: Secondary | ICD-10-CM

## 2022-05-13 NOTE — Procedures (Signed)
    History:  42 year old man with seizure disorder   EEG classification:  Awake and asleep  Description of the recording: The background rhythms of this recording consists of a fairly well modulated low amplitude background activity. As the record progresses, the patient initially is in the waking state, but appears to enter the early stage II sleep during the recording, with rudimentary sleep spindles and vertex sharp wave activity seen. During the wakeful state, photic stimulation is performed, and no abnormal responses were seen. Hyperventilation was also performed, no abnormal response seen. No epileptiform discharges seen during this recording. There was mild diffuse slowing.   Abnormality: Mild diffuse slowing  Impression: This is an abnormal EEG recording in the waking and sleeping state due to mild diffuse slowing which is consistent with a generalized brain dysfunction, nonspecific.    Alric Ran, MD [facility]

## 2022-11-11 ENCOUNTER — Ambulatory Visit: Payer: Medicare Other | Admitting: Neurology

## 2022-12-23 ENCOUNTER — Ambulatory Visit: Payer: Medicare Other | Admitting: Neurology

## 2023-06-04 ENCOUNTER — Emergency Department (HOSPITAL_COMMUNITY)

## 2023-06-04 ENCOUNTER — Other Ambulatory Visit: Payer: Self-pay

## 2023-06-04 ENCOUNTER — Emergency Department (HOSPITAL_COMMUNITY)
Admission: EM | Admit: 2023-06-04 | Discharge: 2023-06-04 | Disposition: A | Discharge status: Home / Self Care | Attending: Emergency Medicine | Admitting: Emergency Medicine

## 2023-06-04 DIAGNOSIS — R55 Syncope and collapse: Secondary | ICD-10-CM | POA: Diagnosis present

## 2023-06-04 DIAGNOSIS — E860 Dehydration: Secondary | ICD-10-CM | POA: Diagnosis not present

## 2023-06-04 LAB — COMPREHENSIVE METABOLIC PANEL
ALT: 12 U/L (ref 0–44)
AST: 14 U/L — ABNORMAL LOW (ref 15–41)
Albumin: 4 g/dL (ref 3.5–5.0)
Alkaline Phosphatase: 56 U/L (ref 38–126)
Anion gap: 11 (ref 5–15)
BUN: 14 mg/dL (ref 6–20)
CO2: 21 mmol/L — ABNORMAL LOW (ref 22–32)
Calcium: 9.4 mg/dL (ref 8.9–10.3)
Chloride: 106 mmol/L (ref 98–111)
Creatinine, Ser: 1.35 mg/dL — ABNORMAL HIGH (ref 0.61–1.24)
GFR, Estimated: >60 mL/min (ref >60)
Glucose, Bld: 182 mg/dL — ABNORMAL HIGH (ref 70–99)
Potassium: 3.4 mmol/L — ABNORMAL LOW (ref 3.5–5.1)
Sodium: 138 mmol/L (ref 135–145)
Total Bilirubin: 1 mg/dL (ref 0.0–1.2)
Total Protein: 8.4 g/dL — ABNORMAL HIGH (ref 6.5–8.1)

## 2023-06-04 LAB — CBC WITH DIFFERENTIAL/PLATELET
Abs Immature Granulocytes: 0.07 10*3/uL (ref 0.00–0.07)
Basophils Absolute: 0.1 10*3/uL (ref 0.0–0.1)
Basophils Relative: 0 %
Eosinophils Absolute: 0.1 10*3/uL (ref 0.0–0.5)
Eosinophils Relative: 1 %
HCT: 46 % (ref 39.0–52.0)
Hemoglobin: 15.5 g/dL (ref 13.0–17.0)
Immature Granulocytes: 0 %
Lymphocytes Relative: 9 %
Lymphs Abs: 1.5 10*3/uL (ref 0.7–4.0)
MCH: 28.8 pg (ref 26.0–34.0)
MCHC: 33.7 g/dL (ref 30.0–36.0)
MCV: 85.5 fL (ref 80.0–100.0)
Monocytes Absolute: 0.8 10*3/uL (ref 0.1–1.0)
Monocytes Relative: 5 %
Neutro Abs: 14.1 10*3/uL — ABNORMAL HIGH (ref 1.7–7.7)
Neutrophils Relative %: 85 %
Platelets: 306 10*3/uL (ref 150–400)
RBC: 5.38 MIL/uL (ref 4.22–5.81)
RDW: 14.3 % (ref 11.5–15.5)
WBC: 16.6 10*3/uL — ABNORMAL HIGH (ref 4.0–10.5)
nRBC: 0 % (ref 0.0–0.2)

## 2023-06-04 LAB — TROPONIN I (HIGH SENSITIVITY): Troponin I (High Sensitivity): 4 ng/L (ref <18)

## 2023-06-04 LAB — LIPASE, BLOOD: Lipase: 39 U/L (ref 11–51)

## 2023-06-04 LAB — MAGNESIUM: Magnesium: 2.3 mg/dL (ref 1.7–2.4)

## 2023-06-04 MED ORDER — LACTATED RINGERS IV BOLUS
1000.0000 mL | Freq: Once | INTRAVENOUS | Status: AC
  Administered 2023-06-04: 1000 mL via INTRAVENOUS

## 2023-06-04 NOTE — ED Provider Notes (Signed)
 [place] EMERGENCY DEPARTMENT AT [facility] Provider Note   CSN: [id number] Arrival date & time: 06/04/23  0030     History  Chief Complaint  Patient presents with   Loss of Consciousness    Jeremy [name] is a 43 y.o. male.  Here with mother who supplements history obtained by EMS. Patient has been with decreased intake for a couple days but thought he was keeping up with fluids. Tonight stood up too quick and felt light headed and syncopized. Caught before he hit the floor, no injuries, no pain anywhere. No preceding palpiations, chest pain, headache, abdominal pain, back pain, blurry vision. Was unresponsive/decreased for a couple minutes. No fevers. No other associated symptoms feels fine now.    Loss of Consciousness      Home Medications Prior to Admission medications   Medication Sig Start Date End Date Taking? Authorizing Provider  HYDROcodone-acetaminophen (NORCO/VICODIN) 5-325 MG tablet Take 1 tablet by mouth at bedtime. 03/26/17  Yes [provider]  lamoTRIgine (LAMICTAL) 150 MG tablet Take 150 mg by mouth daily.    Yes [provider]  topiramate (TOPAMAX) 200 MG tablet Take 200 mg by mouth daily.     Yes [provider]  albuterol (PROAIR HFA) 108 (90 BASE) MCG/ACT inhaler Inhale 1 puff into the lungs every 6 (six) hours as needed for wheezing or shortness of breath.    [provider]  benzonatate (TESSALON) 100 MG capsule Take 2 capsules (200 mg total) by mouth 3 (three) times daily as needed for cough. Swallow whole, do not chew Patient not taking: Reported on 04/28/2022 04/23/17   Triplett, Tammy, PA-C  gabapentin (NEURONTIN) 100 MG capsule Take 100 mg by mouth 2 (two) times daily.    [provider]  naproxen (NAPROSYN) 500 MG tablet Take 500 mg by mouth 2 (two) times daily with a meal.    [provider]  predniSONE (DELTASONE) 20 MG tablet Take 2 tablets (40 mg total) by mouth daily. 04/23/17    Triplett, Tammy, PA-C  tizanidine (ZANAFLEX) 2 MG capsule Take 2 mg by mouth 2 (two) times daily as needed for muscle spasms.    [provider]  Vitamin D, Ergocalciferol, (DRISDOL) 1.25 MG (50000 UNIT) CAPS capsule Take 1 capsule (50,000 Units total) by mouth every 7 (seven) days. 04/29/22   Windell Norfolk, MD      Allergies    Penicillins    Review of Systems   Review of Systems  Cardiovascular:  Positive for syncope.    Physical Exam Updated Vital Signs BP 101/70   Pulse 68   Resp 17   SpO2 95%  Physical Exam Vitals and nursing note reviewed.  Constitutional:      Appearance: He is well-developed.  HENT:     Head: Normocephalic and atraumatic.  Cardiovascular:     Rate and Rhythm: Normal rate.  Pulmonary:     Effort: Pulmonary effort is normal. No respiratory distress.  Abdominal:     General: There is no distension.  Musculoskeletal:        General: Normal range of motion.     Cervical back: Normal range of motion.  Skin:    General: Skin is warm and dry.  Neurological:     Mental Status: He is alert. Mental status is at baseline.     ED Results / Procedures / Treatments   Labs (all labs ordered are listed, but only abnormal results are displayed) Labs Reviewed  CBC WITH  DIFFERENTIAL/PLATELET - Abnormal; Notable for the following components:      Result Value   WBC 16.6 (*)    Neutro Abs 14.1 (*)    All other components within normal limits  COMPREHENSIVE METABOLIC PANEL - Abnormal; Notable for the following components:   Potassium 3.4 (*)    CO2 21 (*)    Glucose, Bld 182 (*)    Creatinine, Ser 1.35 (*)    Total Protein 8.4 (*)    AST 14 (*)    All other components within normal limits  LIPASE, BLOOD  MAGNESIUM  TROPONIN I (HIGH SENSITIVITY)  TROPONIN I (HIGH SENSITIVITY)    EKG None  Radiology DG Chest Portable 1 View Result Date: 06/04/2023 CLINICAL DATA:  Syncopal event.  Diarrhea. EXAM: PORTABLE CHEST 1 VIEW COMPARISON:  04/23/2017  FINDINGS: Stable cardiomediastinal silhouette. No focal consolidation, pleural effusion, or pneumothorax. No displaced rib fractures. Remote left rib fractures. IMPRESSION: No active disease. Electronically Signed   By: Minerva Fester M.D.   On: 06/04/2023 01:20    Procedures Procedures    Medications Ordered in ED Medications  lactated ringers bolus 1,000 mL (1,000 mLs Intravenous New Bag/Given 06/04/23 0059)    ED Course/ Medical Decision Making/ A&P                                 Medical Decision Making Amount and/or Complexity of Data Reviewed Labs: ordered. Radiology: ordered.   Overall patient appears well. Vs WNL and likely dehdyrated with orthostatic syncope. Gave po and IVF here with improvement. Some diarrhea here. Orthostatics and walking asymptomatic after rehydration. Encouraged PO intake and pcp follow up if not improving.   Final Clinical Impression(s) / ED Diagnoses Final diagnoses:  Syncope, unspecified syncope type  Dehydration    Rx / DC Orders ED Discharge Orders     None         Nickayla Mcinnis, Barbara Cower, MD 06/04/23 [phone]

## 2023-06-04 NOTE — ED Notes (Addendum)
 Pt ambulated to bathroom then 2 laps around desk Pt states he feels normal No light headedness or dizziness  Post ambulation BP 105/75(85)

## 2023-06-04 NOTE — ED Triage Notes (Addendum)
 [facility]cems from home. Family called saying he passed out for a few minutes. He doesn't remember event. Did not hit head, family caught him .  Says he has been having diarrhea "just now" . Denies pain. Family has been sick.

## 2023-08-30 ENCOUNTER — Encounter: Payer: Self-pay | Admitting: Neurology

## 2023-08-30 ENCOUNTER — Ambulatory Visit (INDEPENDENT_AMBULATORY_CARE_PROVIDER_SITE_OTHER): Payer: Medicare Other | Admitting: Neurology

## 2023-08-30 VITALS — BP 115/72 | HR 66 | Ht 78.0 in | Wt 168.0 lb

## 2023-08-30 DIAGNOSIS — Z5181 Encounter for therapeutic drug level monitoring: Secondary | ICD-10-CM | POA: Diagnosis not present

## 2023-08-30 DIAGNOSIS — G40909 Epilepsy, unspecified, not intractable, without status epilepticus: Secondary | ICD-10-CM

## 2023-08-30 MED ORDER — TOPIRAMATE 200 MG PO TABS: 200.0000 mg | ORAL_TABLET | Freq: Every day | ORAL | 3 refills | Status: AC

## 2023-08-30 MED ORDER — TOPIRAMATE 200 MG PO TABS: 200.0000 mg | ORAL_TABLET | Freq: Two times a day (BID) | ORAL | 3 refills | Status: DC

## 2023-08-30 MED ORDER — LAMOTRIGINE 150 MG PO TABS: 150.0000 mg | ORAL_TABLET | Freq: Every day | ORAL | 3 refills | Status: AC

## 2023-08-30 MED ORDER — LAMOTRIGINE 150 MG PO TABS: 150.0000 mg | ORAL_TABLET | Freq: Two times a day (BID) | ORAL | 3 refills | Status: DC

## 2023-08-30 NOTE — Progress Notes (Signed)
 [facility]  PATIENT: Jeremy [name] DOB: Apr 22, 1980  REQUESTING CLINICIAN: Darliss Ek, MD HISTORY FROM: Patient, sister, mother over the phone and chart review  REASON FOR VISIT: Epilepsy follow up   HISTORICAL  CHIEF COMPLAINT:  Chief Complaint  Patient presents with   Seizures    RM 13. With mother. SZ MENNINGITTUS. SZ: Pt reports no recent seizures. Reports never spasms.    INTERVAL HISTORY 08/30/2023:  Patient presents today for follow up. He is accompanied by mother. Last visit was a year ago, since then he has been doing well, denies any seizures or seizure like activity. He is compliant with his medications, no side effects. At last visit, topiramate  dose was 150, but today, it is listed at 200 mg twice daily. Mother unable to tell why or when it was increased, but again no seizures.     HISTORY OF PRESENT ILLNESS:  This is a 43 year old gentleman past medical history of seizure disorder, thoracic radiculopathy, history of neonatal meningitis and neonatal stroke who is presenting to establish care.  Patient was previously managed At [facility] neurology but now needs a new neurologist since that establishment is being closed.  In terms of the seizures, mother reported the seizure started as a child, at the age of 26 and his seizure risk factors include neonatal meningitis and history of childhood stroke.  His last seizure was in 1999.  He is on Topamax  150 mg daily and the lamotrigine  150 mg daily tolerating both medications very well and no seizures.  He cannot think the last time he had an EEG or MRI.   He was here also today requesting epidural shots.  I have informed him that we do not provide those services and he will need to see pain management.  Handedness: Right hand  Onset: As a child, age 55   Seizure Type: Generalized convulsion   Current frequency: Last one in 1999  Any injuries from seizures: Denies   Seizure risk factors: Neonatal  meningitis, History of stroke as a child   Previous ASMs: Topiramate , Lamotrigine    Currenty ASMs: Topiramate  200 mg daily, Lamotrigine  150 mg daily   ASMs side effects: Denies   Brain Images: None available for review   Previous EEGs: Diffuse slowing    OTHER MEDICAL CONDITIONS: History of neonatal meningitis, no neuro stroke, seizure disorder, thoracic radiculopathy  REVIEW OF SYSTEMS: Full 14 system review of systems performed and negative with exception of: As noted in the HPI  ALLERGIES: Allergies  Allergen Reactions   Penicillins Rash    Has patient had a PCN reaction causing immediate rash, facial/tongue/throat swelling, SOB or lightheadedness with hypotension: No Has patient had a PCN reaction causing severe rash involving mucus membranes or skin necrosis: No Has patient had a PCN reaction that required hospitalization: No Has patient had a PCN reaction occurring within the last 10 years: No If all of the above answers are "NO", then may proceed with Cephalosporin use.     HOME MEDICATIONS: Outpatient Medications Prior to Visit  Medication Sig Dispense Refill   albuterol  (PROAIR  HFA) 108 (90 BASE) MCG/ACT inhaler Inhale 1 puff into the lungs every 6 (six) hours as needed for wheezing or shortness of breath.     benzonatate  (TESSALON ) 100 MG capsule Take 2 capsules (200 mg total) by mouth 3 (three) times daily as needed for cough. Swallow whole, do not chew 21 capsule 0   gabapentin (NEURONTIN) 100 MG capsule Take 100 mg by mouth 2 (  two) times daily.     HYDROcodone-acetaminophen  (NORCO/VICODIN) 5-325 MG tablet Take 1 tablet by mouth at bedtime.  0   naproxen  (NAPROSYN ) 500 MG tablet Take 500 mg by mouth 2 (two) times daily with a meal.     predniSONE  (DELTASONE ) 20 MG tablet Take 2 tablets (40 mg total) by mouth daily. 10 tablet 0   tizanidine (ZANAFLEX) 2 MG capsule Take 2 mg by mouth 2 (two) times daily as needed for muscle spasms.     lamoTRIgine  (LAMICTAL ) 150 MG  tablet Take 150 mg by mouth daily.      topiramate  (TOPAMAX ) 200 MG tablet Take 200 mg by mouth daily.       Vitamin D , Ergocalciferol , (DRISDOL ) 1.25 MG (50000 UNIT) CAPS capsule Take 1 capsule (50,000 Units total) by mouth every 7 (seven) days. 12 capsule 0   No facility-administered medications prior to visit.    PAST MEDICAL HISTORY: Past Medical History:  Diagnosis Date   Asthma    Back pain    Bronchiectasis    Chronic, left lower lobe   Seizures (HCC)     PAST SURGICAL HISTORY: Past Surgical History:  Procedure Laterality Date   left lower lobectomy  09/07/2010   Dr Percy Bracken   Left thoracotomy, exploration for hemorrhage  09/08/10   Dr Percy Bracken    FAMILY HISTORY: History reviewed. No pertinent family history.  SOCIAL HISTORY: Social History   Socioeconomic History   Marital status: Single    Spouse name: Not on file   Number of children: Not on file   Years of education: Not on file   Highest education level: Not on file  Occupational History   Not on file  Tobacco Use   Smoking status: Never   Smokeless tobacco: Never  Substance and Sexual Activity   Alcohol use: No   Drug use: No   Sexual activity: Not on file  Other Topics Concern   Not on file  Social History Narrative   Not on file   Social Drivers of Health   Financial Resource Strain: Not on file  Food Insecurity: Not on file  Transportation Needs: Not on file  Physical Activity: Not on file  Stress: Not on file  Social Connections: Not on file  Intimate Partner Violence: Not on file    PHYSICAL EXAM  GENERAL EXAM/CONSTITUTIONAL: Vitals:  Vitals:   08/30/23 1057  BP: 115/72  Pulse: 66  Weight: 168 lb (76.2 kg)  Height: 6\' 6"  (1.981 m)   Body mass index is 19.41 kg/m. Wt Readings from Last 3 Encounters:  08/30/23 168 lb (76.2 kg)  04/28/22 165 lb (74.8 kg)  04/23/17 154 lb 6.4 oz (70 kg)   Patient is in no distress; well developed, nourished and groomed; neck is  supple  EYES: Visual fields full to confrontation, Extraocular movements intacts,  No results found.  MUSCULOSKELETAL: Gait, strength, tone, movements noted in Neurologic exam below  NEUROLOGIC: MENTAL STATUS:      No data to display         awake, alert, oriented to person, but not day or year  He has mild dysarthria, able to follow simple commands Unable to count the number of quarter in $1.    CRANIAL NERVE:  2nd, 3rd, 4th, 6th - Visual fields full to confrontation, extraocular muscles intact, no nystagmus 5th - facial sensation symmetric 7th - facial strength symmetric 8th - hearing intact 9th - palate elevates symmetrically, uvula midline 11th - shoulder shrug symmetric 12th -  tongue protrusion midline  MOTOR:  normal bulk and tone, full strength in the BUE, BLE  SENSORY:  normal and symmetric to light touch  COORDINATION:  Mild dysmetria on FNF, worse on left   GAIT/STATION:  normal     DIAGNOSTIC DATA (LABS, IMAGING, TESTING) - I reviewed patient records, labs, notes, testing and imaging myself where available.  Lab Results  Component Value Date   WBC 16.6 (H) 06/04/2023   HGB 15.5 06/04/2023   HCT 46.0 06/04/2023   MCV 85.5 06/04/2023   PLT 306 06/04/2023      Component Value Date/Time   NA 138 06/04/2023 0057   NA 141 04/28/2022 1403   K 3.4 (L) 06/04/2023 0057   CL 106 06/04/2023 0057   CO2 21 (L) 06/04/2023 0057   GLUCOSE 182 (H) 06/04/2023 0057   BUN 14 06/04/2023 0057   BUN 10 04/28/2022 1403   CREATININE 1.35 (H) 06/04/2023 0057   CALCIUM 9.4 06/04/2023 0057   PROT 8.4 (H) 06/04/2023 0057   ALBUMIN 4.0 06/04/2023 0057   AST 14 (L) 06/04/2023 0057   ALT 12 06/04/2023 0057   ALKPHOS 56 06/04/2023 0057   BILITOT 1.0 06/04/2023 0057   GFRNONAA >60 06/04/2023 0057   GFRAA >60 09/07/2014 1944   No results found for: "CHOL", "HDL", "LDLCALC", "LDLDIRECT", "TRIG" No results found for: "HGBA1C" No results found for: "VITAMINB12" No  results found for: "TSH"  Routine EEG 05/13/2022 Mild diffuse slowing   Impression: This is an abnormal EEG recording in the waking and sleeping state due to mild diffuse slowing which is consistent with a generalized brain dysfunction, nonspecific   ASSESSMENT AND PLAN  43 y.o. year old male  with history of meningitis, seizure disorder, childhood stroke who is presenting for follow up for his seizure disorder. He is doing well on Lamotrigine  150 mg daily and Topiramate  200 mg daily. I will check his levels and continue patient on the same doses. I will see him for follow up in a year or sooner if worse    1. Nonintractable epilepsy without status epilepticus, unspecified epilepsy type (HCC)   2. Therapeutic drug monitoring      Patient Instructions  Continue current medications   Lamotrigine  150 mg twice daily  Topiramate  200 mg twice daily  Will check Lamotrigine  and Topiramate  level with BMP  Continue to follow up with PCP  Return in a year or sooner if worse    Per [place]  DMV statutes, patients with seizures are not allowed to drive until they have been seizure-free for six months.  Other recommendations include using caution when using heavy equipment or power tools. Avoid working on ladders or at heights. Take showers instead of baths.  Do not swim alone.  Ensure the water temperature is not too high on the home water heater. Do not go swimming alone. Do not lock yourself in a room alone (i.e. bathroom). When caring for infants or small children, sit down when holding, feeding, or changing them to minimize risk of injury to the child in the event you have a seizure. Maintain good sleep hygiene. Avoid alcohol.  Also recommend adequate sleep, hydration, good diet and minimize stress.   During the Seizure  - First, ensure adequate ventilation and place patients on the floor on their left side  Loosen clothing around the neck and ensure the airway is patent. If the patient  is clenching the teeth, do not force the mouth open with any object as this  can cause severe damage - Remove all items from the surrounding that can be hazardous. The patient may be oblivious to what's happening and may not even know what he or she is doing. If the patient is confused and wandering, either gently guide him/her away and block access to outside areas - Reassure the individual and be comforting - Call 911. In most cases, the seizure ends before EMS arrives. However, there are cases when seizures may last over 3 to 5 minutes. Or the individual may have developed breathing difficulties or severe injuries. If a pregnant patient or a person with diabetes develops a seizure, it is prudent to call an ambulance. - Finally, if the patient does not regain full consciousness, then call EMS. Most patients will remain confused for about 45 to 90 minutes after a seizure, so you must use judgment in calling for help. - Avoid restraints but make sure the patient is in a bed with padded side rails - Place the individual in a lateral position with the neck slightly flexed; this will help the saliva drain from the mouth and prevent the tongue from falling backward - Remove all nearby furniture and other hazards from the area - Provide verbal assurance as the individual is regaining consciousness - Provide the patient with privacy if possible - Call for help and start treatment as ordered by the caregiver   After the Seizure (Postictal Stage)  After a seizure, most patients experience confusion, fatigue, muscle pain and/or a headache. Thus, one should permit the individual to sleep. For the next few days, reassurance is essential. Being calm and helping reorient the person is also of importance.  Most seizures are painless and end spontaneously. Seizures are not harmful to others but can lead to complications such as stress on the lungs, brain and the heart. Individuals with prior lung problems may  develop labored breathing and respiratory distress.     Orders Placed This Encounter  Procedures   Lamotrigine  level   Topiramate  Level   Basic Metabolic Panel    Meds ordered this encounter  Medications   DISCONTD: lamoTRIgine  (LAMICTAL ) 150 MG tablet    Sig: Take 1 tablet (150 mg total) by mouth 2 (two) times daily.    Dispense:  180 tablet    Refill:  3   DISCONTD: topiramate  (TOPAMAX ) 200 MG tablet    Sig: Take 1 tablet (200 mg total) by mouth 2 (two) times daily.    Dispense:  180 tablet    Refill:  3   topiramate  (TOPAMAX ) 200 MG tablet    Sig: Take 1 tablet (200 mg total) by mouth daily.    Dispense:  90 tablet    Refill:  3   lamoTRIgine  (LAMICTAL ) 150 MG tablet    Sig: Take 1 tablet (150 mg total) by mouth daily.    Dispense:  90 tablet    Refill:  3    Return in about 1 year (around 08/29/2024).    Cassandra Cleveland, MD 08/30/2023, 12:05 PM  [facility] [address], [address] [place], [place] [address] [phone]

## 2023-08-30 NOTE — Patient Instructions (Signed)
 Continue current medications   Lamotrigine  150 mg twice daily  Topiramate  200 mg twice daily  Will check Lamotrigine  and Topiramate  level with BMP  Continue to follow up with PCP  Return in a year or sooner if worse

## 2023-08-31 ENCOUNTER — Ambulatory Visit: Payer: Self-pay | Admitting: Neurology

## 2023-08-31 LAB — BASIC METABOLIC PANEL WITH GFR
BUN/Creatinine Ratio: 11 (ref 9–20)
BUN: 12 mg/dL (ref 6–24)
CO2: 22 mmol/L (ref 20–29)
Calcium: 9.8 mg/dL (ref 8.7–10.2)
Chloride: 107 mmol/L — ABNORMAL HIGH (ref 96–106)
Creatinine, Ser: 1.11 mg/dL (ref 0.76–1.27)
Glucose: 82 mg/dL (ref 70–99)
Potassium: 4.6 mmol/L (ref 3.5–5.2)
Sodium: 142 mmol/L (ref 134–144)
eGFR: 84 mL/min/{1.73_m2} (ref >59)

## 2023-08-31 LAB — LAMOTRIGINE LEVEL: Lamotrigine Lvl: 12 ug/mL (ref 2.0–20.0)

## 2023-08-31 LAB — TOPIRAMATE LEVEL: Topiramate Lvl: 3.6 ug/mL (ref 2.0–25.0)

## 2024-08-30 ENCOUNTER — Ambulatory Visit: Admitting: Neurology

## 2024-10-09 ENCOUNTER — Ambulatory Visit: Admitting: Neurology
